# Patient Record
Sex: Female | Born: 1995 | Hispanic: Yes | Marital: Married | Smoking: Never smoker
Health system: Southern US, Community
[De-identification: ages and names within clinical notes are randomized; demographics above are authoritative.]

## PROBLEM LIST (undated history)

## (undated) ENCOUNTER — Inpatient Hospital Stay (HOSPITAL_COMMUNITY): Payer: Self-pay

## (undated) DIAGNOSIS — E559 Vitamin D deficiency, unspecified: Secondary | ICD-10-CM

## (undated) DIAGNOSIS — J45909 Unspecified asthma, uncomplicated: Secondary | ICD-10-CM

## (undated) DIAGNOSIS — Z6221 Child in welfare custody: Secondary | ICD-10-CM

## (undated) DIAGNOSIS — D509 Iron deficiency anemia, unspecified: Secondary | ICD-10-CM

## (undated) HISTORY — DX: Vitamin D deficiency, unspecified: E55.9

## (undated) HISTORY — DX: Unspecified asthma, uncomplicated: J45.909

## (undated) HISTORY — DX: Iron deficiency anemia, unspecified: D50.9

## (undated) HISTORY — DX: Child in welfare custody: Z62.21

---
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
## 2015-01-08 ENCOUNTER — Ambulatory Visit: Payer: Medicaid Other | Admitting: *Deleted

## 2015-03-26 ENCOUNTER — Ambulatory Visit: Payer: Medicaid Other | Admitting: Pediatrics

## 2015-04-24 ENCOUNTER — Encounter: Payer: Self-pay | Admitting: *Deleted

## 2015-04-24 ENCOUNTER — Encounter: Payer: Self-pay | Admitting: Pediatrics

## 2015-04-24 ENCOUNTER — Ambulatory Visit (INDEPENDENT_AMBULATORY_CARE_PROVIDER_SITE_OTHER): Payer: Medicaid Other | Admitting: Pediatrics

## 2015-04-24 VITALS — BP 117/77 | HR 86 | Ht 64.57 in | Wt 264.4 lb

## 2015-04-24 DIAGNOSIS — Z3046 Encounter for surveillance of implantable subdermal contraceptive: Secondary | ICD-10-CM | POA: Diagnosis not present

## 2015-04-24 DIAGNOSIS — R7303 Prediabetes: Secondary | ICD-10-CM | POA: Diagnosis not present

## 2015-04-24 DIAGNOSIS — J45990 Exercise induced bronchospasm: Secondary | ICD-10-CM | POA: Diagnosis not present

## 2015-04-24 DIAGNOSIS — E669 Obesity, unspecified: Secondary | ICD-10-CM

## 2015-04-24 MED ORDER — ALBUTEROL SULFATE HFA 108 (90 BASE) MCG/ACT IN AERS: 2.0000 | INHALATION_SPRAY | Freq: Four times a day (QID) | RESPIRATORY_TRACT | Status: DC | PRN

## 2015-04-24 NOTE — Progress Notes (Signed)
Pre-Visit Planning  Donna [name]  is a 20 y.o. female referred by [facility], MD.   Last seen in [facility] on 12/24/2014 for prediabetes and nexplanon f/u.   Previous Psych Screenings? n/a  Treatment plan at last visit included continue nutrition visits and nexplanon f/u.   Clinical Staff Visit Tasks:   - Urine GC/CT due? no - Psych Screenings Due? no - POCT HgbA1C  Provider Visit Tasks: - Assess nutrition and exercise associated with prediabetes - Consider metformin if continued weight gain and persistent prediabetes - [facility]ment? Maybe - Pertinent Labs? Yes Component     Latest Ref Rng 12/24/2014  Hemoglobin A1C      5.8

## 2015-04-24 NOTE — Progress Notes (Signed)
THIS RECORD MAY CONTAIN CONFIDENTIAL INFORMATION THAT SHOULD NOT BE RELEASED WITHOUT REVIEW OF THE SERVICE PROVIDER.  Adolescent Medicine Consultation Follow-Up Visit Donna [name]  is a 20 y.o. female referred by Karlene Einstein, MD here today for follow-up.    Previsit planning completed:  yes Pre-Visit Planning  Donna [name]  is a 20 y.o. female referred by [facility], Bascom Levels, MD.   Last seen in [facility] on 12/24/2014 for prediabetes and nexplanon f/u.   Previous Psych Screenings? n/a  Treatment plan at last visit included continue nutrition visits and nexplanon f/u.   Clinical Staff Visit Tasks:   - Urine GC/CT due? no - Psych Screenings Due? no - POCT HgbA1C  Provider Visit Tasks: - Assess nutrition and exercise associated with prediabetes - Consider metformin if continued weight gain and persistent prediabetes - [facility] Involvement? Maybe - Pertinent Labs? Yes Component     Latest Ref Rng 12/24/2014  Hemoglobin A1C      5.8   Growth Chart Viewed? yes   History was provided by the patient.  PCP Confirmed?  yes  My Chart Activated?   yes   HPI:    Started going to gym twice weekly, has not seen much change in weight  Discussed other benefits of exercise Pleased with nexplanon  Using albuterol pre and post exercise and it helps a lot.  Needs refill.  No LMP recorded. Allergies  Allergen Reactions  . Penicillins Swelling   Outpatient Encounter Prescriptions as of 04/24/2015  Medication Sig  . albuterol (PROVENTIL HFA;VENTOLIN HFA) 108 (90 Base) MCG/ACT inhaler Inhale 2 puffs into the lungs every 6 (six) hours as needed for wheezing or shortness of breath.  . Multiple Vitamin (MULTIVITAMIN) tablet Take 1 tablet by mouth daily.  . [DISCONTINUED] albuterol (PROVENTIL HFA;VENTOLIN HFA) 108 (90 BASE) MCG/ACT inhaler Inhale 2 puffs into the lungs every 6 (six) hours as needed for wheezing or shortness of breath.  . [DISCONTINUED] ALBUTEROL IN Inhale into  the lungs.   No facility-administered encounter medications on file as of 04/24/2015.     Patient Active Problem List   Diagnosis Date Noted  . Implantable subdermal contraceptive surveillance 10/11/2014  . Iron deficiency anemia 02/19/2014  . Pre-diabetes 09/06/2013  . Seasonal allergies 07/24/2013  . Teenage mother 06/05/2013  . 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 Obesity (BMI 30-39.9) 06/05/2013  . Nevus 06/05/2013     Social History   Social History Narrative  No change in sexual partner  The following portions of the patient's history were reviewed and updated as appropriate: allergies, current medications and problem list.  Physical Exam:  Filed Vitals:   04/24/15 1013  BP: 117/77  Pulse: 86  Height: 5' 4.57" (1.64 m)  Weight: 264 lb 6.4 oz (119.931 kg)   BP 117/77 mmHg  Pulse 86  Ht 5' 4.57" (1.64 m)  Wt 264 lb 6.4 oz (119.931 kg)  BMI 44.59 kg/m2 Body mass index: body mass index is 44.59 kg/(m^2). Blood pressure percentiles are [id number] systolic and A999333 diastolic based on [id number] NHANES data. Blood pressure percentile targets: 90: 124/78, 95: 127/82, 99 + 5 mmHg: 140/95.  Physical Exam  Constitutional: No distress.  Neck: No thyromegaly present.  Cardiovascular: Normal rate and regular rhythm.   No murmur heard. Pulmonary/Chest: Breath sounds normal.  Abdominal: Soft. There is no tenderness. There is no guarding.  Musculoskeletal: She exhibits no edema.  Lymphadenopathy:    She has no cervical adenopathy.  Neurological: She is alert.  Skin:  Acanthosis  nigricans  Nursing note and vitals reviewed.  Assessment/Plan: 1. Pre-diabetes 2. Obesity (BMI 30-39.9) Positive reinforced lifestyle changes. - Hemoglobin A1c - Amb ref to Medical Nutrition Therapy-MNT 3. Exercise-induced bronchospasm - albuterol (PROVENTIL HFA;VENTOLIN HFA) 108 (90 Base) MCG/ACT inhaler; Inhale 2 puffs into the lungs every 6 (six) hours as needed for wheezing or shortness of breath.   Dispense: 1 Inhaler; Refill: 0  4. Implantable subdermal contraceptive surveillance Continue with this pregnancy prevention method.   Follow-up:  Return in about 3 months (around 07/22/2015) for Prediabetes, with Chrys Racer.   Medical decision-making:  > 25 minutes spent, more than 50% of appointment was spent discussing diagnosis and management of symptoms

## 2015-06-02 ENCOUNTER — Ambulatory Visit: Payer: Self-pay | Admitting: *Deleted

## 2015-06-06 ENCOUNTER — Encounter: Payer: Self-pay | Admitting: Pediatrics

## 2015-06-06 ENCOUNTER — Ambulatory Visit (INDEPENDENT_AMBULATORY_CARE_PROVIDER_SITE_OTHER): Payer: Medicaid Other | Admitting: Pediatrics

## 2015-06-06 VITALS — Temp 97.0°F | Wt 266.8 lb

## 2015-06-06 DIAGNOSIS — J069 Acute upper respiratory infection, unspecified: Secondary | ICD-10-CM | POA: Diagnosis not present

## 2015-06-06 DIAGNOSIS — B9789 Other viral agents as the cause of diseases classified elsewhere: Principal | ICD-10-CM

## 2015-06-06 NOTE — Progress Notes (Signed)
  Subjective:    Donna [name] is a 20 y.o. old female here with her son for Cough and Fever .   She presents with 3-4 days of fevers to 103 which has come down with Tylenol. She also has cough. She has had runny nose. She has lost her appetite as well. Boyfriend has been sick with same problems. 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 No sore throat. But is having some headache and ear pain.   HPI  Review of Systems  History and Problem List: Donna [name] has Teenage mother; Exercise-induced bronchospasm; Obesity (BMI 30-39.9); Nevus; Seasonal allergies; Pre-diabetes; Iron deficiency anemia; and Implantable subdermal contraceptive surveillance on her problem list.  Donna [name]  has a past medical history of Asthma; Vitamin D deficiency (09/06/2013); and Child in foster care (06/05/2013).  Immunizations needed: none     Objective:    Temp(Src) 97 F (36.1 C) (Temporal)  Wt 266 lb 12.8 oz (121.02 kg)  LMP 12/07/2014 (Within Months) Physical Exam  Constitutional: She is oriented to person, place, and time. She appears well-developed.  Tired appearing   HENT:  Right Ear: External ear normal.  Left Ear: External ear normal.  Mouth/Throat: Oropharynx is clear and moist. No oropharyngeal exudate.  Eyes: Pupils are equal, round, and reactive to light. Right eye exhibits no discharge.  Neck: Normal range of motion. Neck supple. No thyromegaly present.  Cardiovascular: Normal rate and regular rhythm.   No murmur heard. Pulmonary/Chest: Effort normal and breath sounds normal. No respiratory distress. She has no wheezes.  Abdominal: Soft. She exhibits no distension. There is no tenderness.  Musculoskeletal: Normal range of motion. She exhibits no edema.  Neurological: She is alert and oriented to person, place, and time.  Skin: Skin is warm.  Psychiatric: She has a normal mood and affect. Her behavior is normal.       Assessment and Plan:     Donna [name] was seen today for Cough and  Fever Donna [name] likely has a viral uri based on duration of symptoms and physical exam ( normal work of breathing) . Given high BMI and comorbidities we considered flu swabbing but she would be out of tamiflu window and appears relatively well on exam, therefore we did not swab. Supportive care recommended.    Problem List Items Addressed This Visit    None    Visit Diagnoses    Viral URI with cough    -  Primary       Return if symptoms worsen or fail to improve.  Donna [name], Debbra Riding, MD

## 2015-06-06 NOTE — Patient Instructions (Signed)
Rest, lots of fluids, should improve by Monday.   Infecciones virales (Viral Infections) La causa de las infecciones virales son diferentes tipos de virus.La mayora de las infecciones virales no son graves y se curan solas. Sin embargo, algunas infecciones pueden provocar sntomas graves y causar complicaciones.  SNTOMAS Las infecciones virales ocasionan:   Dolores de Investment banker, operational.  Molestias.  Dolor de [place].  Mucosidad nasal.  Diferentes tipos de erupcin.  Lagrimeo.  Cansancio.  Tos.  Prdida del apetito.  Infecciones gastrointestinales que producen nuseas, vmitos y [place]. Estos sntomas no responden a los antibiticos porque la infeccin no es por bacterias. Sin embargo, puede sufrir una infeccin bacteriana luego de la infeccin viral. Se denomina sobreinfeccin. Los sntomas de esta infeccin bacteriana son:   Kingsley Spittle dolor en la garganta con pus y dificultad para tragar.  Ganglios hinchados en el cuello.  Escalofros y fiebre muy elevada o persistente.  Dolor de cabeza intenso.  Sensibilidad en los senos paranasales.  Malestar (sentirse enfermo) general persistente, dolores musculares y fatiga (cansancio).  Tos persistente.  Produccin mucosa con la tos, de color amarillo, verde o marrn. INSTRUCCIONES PARA EL CUIDADO DOMICILIARIO  Solo tome medicamentos que se pueden comprar sin receta o recetados para Conservation officer, historic buildings, Tree surgeon, la diarrea o la fiebre, como le indica el mdico.  Beba gran cantidad de lquido para mantener la orina de tono claro o color amarillo plido. Las bebidas deportivas proporcionan electrolitos,azcares e hidratacin.  Descanse lo suficiente y [employer]. Puede tomar sopas y caldos con crackers o arroz. SOLICITE ATENCIN [place] DE INMEDIATO SI:  Tiene dolor de cabeza, le falta el aire, siente dolor en el pecho, en el cuello o aparece una erupcin.  Tiene vmitos o diarrea intensos y no puede retener lquidos.  Usted o su  nio tienen una temperatura oral de ms de 38,9 C (102 F) y no puede controlarla con medicamentos.  Su beb tiene ms de 3 meses y su temperatura rectal es de 102 F (38.9 C) o ms.  Su beb tiene 3 meses o menos y su temperatura rectal es de 100.4 F (38 C) o ms. EST SEGURO QUE:   Comprende las instrucciones para el alta mdica.  Controlar su enfermedad.  Solicitar atencin mdica de inmediato segn las indicaciones.   Esta informacin no tiene Marine scientist el consejo del mdico. Asegrese de hacerle al mdico cualquier pregunta que tenga.   Document Released: 12/16/2004 Document Revised: 05/31/2011 Elsevier Interactive Patient Education [employer].

## 2015-06-06 NOTE — Progress Notes (Signed)
I saw and evaluated the patient, performing the key elements of the service. I developed the management plan that is described in the resident's note, and I agree with the content.   Georgia Duff B                  06/06/2015, 10:54 PM

## 2015-06-14 ENCOUNTER — Emergency Department (HOSPITAL_COMMUNITY): Payer: Self-pay

## 2015-06-14 ENCOUNTER — Encounter (HOSPITAL_COMMUNITY): Payer: Self-pay | Admitting: Emergency Medicine

## 2015-06-14 ENCOUNTER — Emergency Department (HOSPITAL_COMMUNITY)
Admission: EM | Admit: 2015-06-14 | Discharge: 2015-06-14 | Disposition: A | Discharge status: Home / Self Care | Payer: Self-pay | Attending: Emergency Medicine | Admitting: Emergency Medicine

## 2015-06-14 DIAGNOSIS — J45909 Unspecified asthma, uncomplicated: Secondary | ICD-10-CM | POA: Insufficient documentation

## 2015-06-14 DIAGNOSIS — S199XXA Unspecified injury of neck, initial encounter: Secondary | ICD-10-CM | POA: Insufficient documentation

## 2015-06-14 DIAGNOSIS — Y92410 Unspecified street and highway as the place of occurrence of the external cause: Secondary | ICD-10-CM | POA: Insufficient documentation

## 2015-06-14 DIAGNOSIS — Y998 Other external cause status: Secondary | ICD-10-CM | POA: Insufficient documentation

## 2015-06-14 DIAGNOSIS — S29001A Unspecified injury of muscle and tendon of front wall of thorax, initial encounter: Secondary | ICD-10-CM | POA: Insufficient documentation

## 2015-06-14 DIAGNOSIS — E669 Obesity, unspecified: Secondary | ICD-10-CM | POA: Insufficient documentation

## 2015-06-14 DIAGNOSIS — Z880 Allergy status to penicillin: Secondary | ICD-10-CM | POA: Insufficient documentation

## 2015-06-14 DIAGNOSIS — S8991XA Unspecified injury of right lower leg, initial encounter: Secondary | ICD-10-CM | POA: Insufficient documentation

## 2015-06-14 DIAGNOSIS — S3991XA Unspecified injury of abdomen, initial encounter: Secondary | ICD-10-CM | POA: Insufficient documentation

## 2015-06-14 DIAGNOSIS — Z79899 Other long term (current) drug therapy: Secondary | ICD-10-CM | POA: Insufficient documentation

## 2015-06-14 DIAGNOSIS — S4992XA Unspecified injury of left shoulder and upper arm, initial encounter: Secondary | ICD-10-CM | POA: Insufficient documentation

## 2015-06-14 DIAGNOSIS — Y9389 Activity, other specified: Secondary | ICD-10-CM | POA: Insufficient documentation

## 2015-06-14 DIAGNOSIS — M7918 Myalgia, other site: Secondary | ICD-10-CM

## 2015-06-14 DIAGNOSIS — Z3202 Encounter for pregnancy test, result negative: Secondary | ICD-10-CM | POA: Insufficient documentation

## 2015-06-14 LAB — CBC WITH DIFFERENTIAL/PLATELET
BASOS ABS: 0 10*3/uL (ref 0.0–0.1)
BASOS PCT: 0 %
EOS ABS: 2.1 10*3/uL — AB (ref 0.0–0.7)
Eosinophils Relative: 17 %
HCT: 40.4 % (ref 36.0–46.0)
Hemoglobin: 14.1 g/dL (ref 12.0–15.0)
Lymphocytes Relative: 25 %
Lymphs Abs: 3.1 10*3/uL (ref 0.7–4.0)
MCH: 27.5 pg (ref 26.0–34.0)
MCHC: 34.9 g/dL (ref 30.0–36.0)
MCV: 78.9 fL (ref 78.0–100.0)
MONO ABS: 0.8 10*3/uL (ref 0.1–1.0)
MONOS PCT: 6 %
NEUTROS PCT: 52 %
Neutro Abs: 6.7 10*3/uL (ref 1.7–7.7)
Platelets: 368 10*3/uL (ref 150–400)
RBC: 5.12 MIL/uL — ABNORMAL HIGH (ref 3.87–5.11)
RDW: 13.3 % (ref 11.5–15.5)
WBC: 12.7 10*3/uL — ABNORMAL HIGH (ref 4.0–10.5)

## 2015-06-14 LAB — I-STAT CHEM 8, ED
BUN: 10 mg/dL (ref 6–20)
Calcium, Ion: 1.19 mmol/L (ref 1.12–1.23)
Chloride: 105 mmol/L (ref 101–111)
Creatinine, Ser: 0.6 mg/dL (ref 0.44–1.00)
Glucose, Bld: 138 mg/dL — ABNORMAL HIGH (ref 65–99)
HCT: 46 % (ref 36.0–46.0)
Hemoglobin: 15.6 g/dL — ABNORMAL HIGH (ref 12.0–15.0)
Potassium: 3.7 mmol/L (ref 3.5–5.1)
Sodium: 141 mmol/L (ref 135–145)
TCO2: 22 mmol/L (ref 0–100)

## 2015-06-14 LAB — I-STAT BETA HCG BLOOD, ED (MC, WL, AP ONLY): I-stat hCG, quantitative: <5 m[IU]/mL

## 2015-06-14 MED ORDER — MORPHINE SULFATE (PF) 4 MG/ML IV SOLN
4.0000 mg | Freq: Once | INTRAVENOUS | Status: AC
  Administered 2015-06-14: 4 mg via INTRAVENOUS
  Filled 2015-06-14: qty 1

## 2015-06-14 MED ORDER — ONDANSETRON HCL 4 MG/2ML IJ SOLN
4.0000 mg | Freq: Once | INTRAMUSCULAR | Status: AC
  Administered 2015-06-14: 4 mg via INTRAVENOUS
  Filled 2015-06-14: qty 2

## 2015-06-14 MED ORDER — IOPAMIDOL (ISOVUE-300) INJECTION 61%
100.0000 mL | Freq: Once | INTRAVENOUS | Status: AC | PRN
  Administered 2015-06-14: 100 mL via INTRAVENOUS

## 2015-06-14 MED ORDER — OXYCODONE-ACETAMINOPHEN 5-325 MG PO TABS: ORAL_TABLET | ORAL | Status: DC

## 2015-06-14 NOTE — ED Notes (Signed)
Patient d/c'd self care.  F/U and medications reviewed.  Patient verbalized understanding. 
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
## 2015-07-15 ENCOUNTER — Encounter: Payer: Self-pay | Admitting: Pediatrics

## 2015-07-15 NOTE — Progress Notes (Signed)
Pre-Visit Planning  Donna [name]  is a 20 y.o. female referred by [facility], MD.   Last seen in [facility] on 04/24/2015 for nexplanon f/u and prediabetes/obesity.   Plan included continue nexplanon, reinforced lifestyle changes and re-referred to nutrition.  Date and Type of Previous Psych Screenings? NA  Clinical Staff Visit Tasks:   - Urine GC/CT due? no - HIV Screening due?  yes - Psych Screenings Due? NA  Provider Visit Tasks: - Review prediabetes, check hgba1c - Assess nexplanon risks and benefits - [facility] Involvement? Maybe - Pertinent Labs? No  >5 minutes spent reviewing records and planning for patient's visit.

## 2015-07-16 ENCOUNTER — Ambulatory Visit (INDEPENDENT_AMBULATORY_CARE_PROVIDER_SITE_OTHER): Payer: Medicaid Other | Admitting: Pediatrics

## 2015-07-16 ENCOUNTER — Encounter: Payer: Self-pay | Admitting: Pediatrics

## 2015-07-16 VITALS — BP 115/79 | HR 93 | Ht 64.57 in | Wt 271.4 lb

## 2015-07-16 DIAGNOSIS — Z113 Encounter for screening for infections with a predominantly sexual mode of transmission: Secondary | ICD-10-CM

## 2015-07-16 DIAGNOSIS — Z309 Encounter for contraceptive management, unspecified: Secondary | ICD-10-CM

## 2015-07-16 DIAGNOSIS — J45990 Exercise induced bronchospasm: Secondary | ICD-10-CM

## 2015-07-16 DIAGNOSIS — Z3009 Encounter for other general counseling and advice on contraception: Secondary | ICD-10-CM

## 2015-07-16 DIAGNOSIS — R7303 Prediabetes: Secondary | ICD-10-CM

## 2015-07-16 LAB — HEPATIC FUNCTION PANEL
ALK PHOS: 77 U/L (ref 47–176)
ALT: 27 U/L (ref 5–32)
AST: 19 U/L (ref 12–32)
Albumin: 4 g/dL (ref 3.6–5.1)
BILIRUBIN DIRECT: 0.1 mg/dL (ref <=0.2)
BILIRUBIN INDIRECT: 0.3 mg/dL (ref 0.2–1.1)
Total Bilirubin: 0.4 mg/dL (ref 0.2–1.1)
Total Protein: 6.9 g/dL (ref 6.3–8.2)

## 2015-07-16 LAB — HEMOGLOBIN A1C
Hgb A1c MFr Bld: 5.5 % (ref <5.7)
MEAN PLASMA GLUCOSE: 111 mg/dL

## 2015-07-16 MED ORDER — METFORMIN HCL ER 500 MG PO TB24: 500.0000 mg | ORAL_TABLET | Freq: Every day | ORAL | Status: DC

## 2015-07-16 MED ORDER — ALBUTEROL SULFATE HFA 108 (90 BASE) MCG/ACT IN AERS: 2.0000 | INHALATION_SPRAY | Freq: Four times a day (QID) | RESPIRATORY_TRACT | Status: DC | PRN

## 2015-07-16 NOTE — Patient Instructions (Addendum)
We will start metformin ( a medication to help you control your blood sugar).  You should also start on a multivitamin.  We want to ensure you will have a healthy pregnancy and treating your prediabetes is a really important part of that.  We will follow you closely every 2 weeks until you have enough medicine to treat the prediabetes.  We have re-opened the referral to Nutrition.  You can call their office to schedule an appointment, [phone].

## 2015-07-16 NOTE — Progress Notes (Signed)
Adolescent Medicine Consultation Follow-Up Visit Donna [name]  is a 20 y.o. female referred by [facility], Bascom Levels, MD here today for follow-up of nexplanon, pre-diabetes/obestiy.   Previsit planning completed:  yes  Growth Chart Viewed? yes  PCP Confirmed?  Yes, Karlene Einstein   History was provided by the patient.  HPI:   Nexplanon: Patient requests removal of nexplanon at this time. She believes that nexplanon is preventing her from losing weight. She is not interested in alternate method of BC because she is interested in having another baby. She anticipates getting pregnant in the next 4-5 months. She would like to lose weight prior to pregnancy. She denies additional side effects from nexplanon (no BTB). Since her last visit, Donna [name] is now married, (married partner of 3 years 2 months ago). She has moved to a new apartment in [place] and is living with husband and son. She has adjusted well to being married. Gets along well with husband. Mom is nervous about planning for another baby. Dad is READY to have another (would like a baby girl). She denies excessive pressure from husband to have another baby.   Pre-diabetes: Laihla has started an exercise regimen. She is going to gym 3-4 times weekly for the past 3 months. Exercise regimen consists of working out 1-2 hours. She is disappointed because she has not seen any weight loss. She walks for 45 minutes, 20 minutes abdominal exercises, and 20 minutes legs machine with weights weight. No trainer at this time. Friend is doing the same regimen and losing weight. Has a gym partner who is going with her and has been successful at losing weight.  She eats three meals daily. Egg sandwich in AM, cereal. Lunch- chikcen, rice, salad, lettuce. Dinner: cooks sphagetti, soup, rice with chicken, carrots. Was called by nutrition, but did not call back to schedule appointment. Would be interested in meeting with nutrition.    No LMP recorded. Patient has had an  implant.  The following portions of the patient's history were reviewed and updated as appropriate: allergies, current medications, past family history, past medical history, past social history, past surgical history and problem list.  Allergies  Allergen Reactions  . Penicillins Swelling    Swelling of the face  Has patient had a PCN reaction causing immediate rash, facial/tongue/throat swelling, SOB or lightheadedness with hypotension: Yes Has patient had a PCN reaction causing severe rash involving mucus membranes or skin necrosis: No Has patient had a PCN reaction that required hospitalization No Has patient had a PCN reaction occurring within the last 10 years: Yes - was taken 5 years ago  If all of the above answers are "NO", then may proceed with Cephalosporin use.      Social History: Lives with husband, son Einar Pheasant, 2).  Sleep: sleeping well bed at 10:30 up at [place]: [employer] for high school diploma. Graduation July 11th. Starting [employer]). Husband works in Financial planner.   Confidentiality was discussed with the patient and if applicable, with caregiver as well. Tobacco? no Secondhand smoke exposure?no Drugs/EtOH?no Sexually active?yes 1 female partner, no new partners Pregnancy Prevention: nexplanon in place, Safe at home, in school & in relationships? Yes Safe to self? Yes  Physical Exam:  Filed Vitals:   07/16/15 0939  BP: 115/79  Pulse: 93  Height: 5' 4.57" (1.64 m)  Weight: 271 lb 6.4 oz (123.106 kg)   BP 115/79 mmHg  Pulse 93  Ht 5' 4.57" (1.64 m)  Wt 271 lb 6.4  oz (123.106 kg)  BMI 45.77 kg/m2 Body mass index: body mass index is 45.77 kg/(m^2). Blood pressure percentiles are A999333 systolic and [id number] diastolic based on [id number] NHANES data. Blood pressure percentile targets: 90: 123/78, 95: 127/82, 99 + 5 mmHg: 139/94.  Physical Exam   General:   alert, cooperative and no distress. Obese adolescent female. Sitting upright in chair.    Skin:   acanthosis nigricans to neck   Oral cavity:   lips, mucosa, and tongue normal; teeth and gums normal  Eyes:   sclerae white, pupils equal and reactive, red reflex normal bilaterally  Ears:   normal bilaterally  Nose: clear, no discharge  Neck:  Neck appearance: Normal  Lungs:  clear to auscultation bilaterally  Heart:   regular rate and rhythm, S1, S2 normal, no murmur, click, rub or gallop   Abdomen:  Obese abdomen, soft, non-tender; bowel sounds normal; no masses,  no organomegaly  GU:  normal female - testes descended bilaterally and uncircumcised  Extremities:   extremities normal, atraumatic, no cyanosis or edema  Neuro:  normal without focal findings, mental status, speech normal, alert and oriented x3, PERLA, cranial nerves 2-12 intact, muscle tone and strength normal and symmetric, reflexes normal and symmetric and sensation grossly normal    Assessment/Plan: 1. Pre-diabetes Patient endorses compliance with exercise regimen and no successful weight loss. She is frustrated by this. We discussed pre-diabetes and ramifications of insulin resistance and weight. Recommended weight loss to promote future heathy pregnancy and general health. Will obtain baseline HFP and follow up A1C (last 10/4, 5.8).  Patient in agreement with starting metformin at this time. Counseled regarding side effects. Emphasis placed on balanced nutrition. Counseled to call nutritionist to schedule appointment. Will follow up in 2 weeks.  - Hemoglobin A1c - Hepatic function panel - metFORMIN (GLUCOPHAGE XR) 500 MG 24 hr tablet; Take 1 tablet (500 mg total) by mouth daily with breakfast.  Dispense: 30 tablet; Refill: 1  2. Screen for sexually transmitted diseases Patient denies new sexual partner, but has not been screened for HIV. Will obtain today.  - HIV antibody  3. Exercise-induced bronchospasm Patient requests refill of albuterol inhaler. Will refill today.  - albuterol (PROVENTIL HFA;VENTOLIN HFA)  108 (90 Base) MCG/ACT inhaler; Inhale 2 puffs into the lungs every 6 (six) hours as needed for wheezing or shortness of breath.  Dispense: 1 Inhaler; Refill: 0  4. Encounter for counseling regarding contraception Emphasis placed on importance of maternal health and neonatal health at this visit. Patient in agreement to continue nexplanon at this time during trial of metformin, exercise, and nutrition. She will continue nexplanon for the next 3 months while optimizing health. Counseled to initiate pre-natal vitamins at this time.   Follow-up:  Return in about 2 weeks (around 07/30/2015) for Prediabetes, with Chrys Racer.   Medical decision-making:  > 25 minutes spent, more than 50% of appointment was spent discussing diagnosis and management of symptoms

## 2015-07-17 LAB — HIV ANTIBODY (ROUTINE TESTING W REFLEX): HIV: NONREACTIVE

## 2015-07-17 NOTE — Progress Notes (Signed)
Quick Note:  Please notify patient that her labs were all normal, including her hgba1C. This means that although she has not lost weight, the exercise she has been doing has resulted in an improvement in her labs. She should continue working to improve her health as likely she will experience more results including her goal of weight loss. She should continue the metformin for now. ______

## 2015-07-18 ENCOUNTER — Telehealth: Payer: Self-pay | Admitting: *Deleted

## 2015-07-18 NOTE — Telephone Encounter (Signed)
TC to pt. Updated on labs. Pt verbalized understanding.

## 2015-07-18 NOTE — Telephone Encounter (Signed)
-----   Message from Gaspar Skeeters, MD sent at 07/17/2015 10:17 PM EDT ----- Please notify patient that her labs were all normal, including her hgba1C.  This means that although she has not lost weight, the exercise she has been doing has resulted in an improvement in her labs.  She should continue working to improve her health as likely she will experience more results including her goal of weight loss.  She should continue the metformin for now.

## 2015-07-21 ENCOUNTER — Ambulatory Visit: Payer: Self-pay | Admitting: Pediatrics

## 2015-07-25 ENCOUNTER — Other Ambulatory Visit: Payer: Self-pay | Admitting: Pediatrics

## 2015-07-25 DIAGNOSIS — R7303 Prediabetes: Secondary | ICD-10-CM

## 2015-07-25 MED ORDER — METFORMIN HCL ER 500 MG PO TB24: ORAL_TABLET | ORAL | Status: DC

## 2015-07-30 ENCOUNTER — Ambulatory Visit: Payer: Medicaid Other | Admitting: Pediatrics

## 2015-08-21 ENCOUNTER — Encounter: Payer: Self-pay | Admitting: Pediatrics

## 2015-08-21 ENCOUNTER — Ambulatory Visit: Payer: Medicaid Other | Admitting: Pediatrics

## 2015-08-21 NOTE — Progress Notes (Signed)
Pre-Visit Planning  Donna [name]  is a 20 y.o. female referred by [facility], Bascom Levels, MD.   Last seen in [facility] on 07/16/15 for prediabetes, contraception   Plan at last visit included discuss family planning goals, preconception health.  Date and Type of Previous Psych Screenings? NO  Clinical Staff Visit Tasks:   - Urine GC/CT due? no - HIV Screening due?  no - Psych Screenings Due? No  Provider Visit Tasks: - start PNV - discuss success with health - [facility] Involvement? No - Pertinent Labs? Yes  Results for orders placed or performed in visit on 07/16/15  Hemoglobin A1c  Result Value Ref Range   Hgb A1c MFr Bld 5.5 <5.7 %   Mean Plasma Glucose 111 mg/dL  HIV antibody  Result Value Ref Range   HIV 1&2 Ab, 4th Generation NONREACTIVE NONREACTIVE  Hepatic function panel  Result Value Ref Range   Total Bilirubin 0.4 0.2 - 1.1 mg/dL   Bilirubin, Direct 0.1 <=0.2 mg/dL   Indirect Bilirubin 0.3 0.2 - 1.1 mg/dL   Alkaline Phosphatase 77 47 - 176 U/L   AST 19 12 - 32 U/L   ALT 27 5 - 32 U/L   Total Protein 6.9 6.3 - 8.2 g/dL   Albumin 4.0 3.6 - 5.1 g/dL     >5 minutes spent reviewing records and planning for patient's visit.

## 2015-10-07 ENCOUNTER — Ambulatory Visit (INDEPENDENT_AMBULATORY_CARE_PROVIDER_SITE_OTHER): Payer: Medicaid Other | Admitting: Family

## 2015-10-07 ENCOUNTER — Encounter: Payer: Self-pay | Admitting: Family

## 2015-10-07 VITALS — BP 127/75 | HR 82 | Ht 64.5 in | Wt 273.0 lb

## 2015-10-07 DIAGNOSIS — Z3046 Encounter for surveillance of implantable subdermal contraceptive: Secondary | ICD-10-CM

## 2015-10-07 MED ORDER — PRENATAL VITAMINS PLUS 27-1 MG PO TABS: 1.0000 | ORAL_TABLET | Freq: Every day | ORAL | Status: DC

## 2015-10-07 NOTE — Progress Notes (Signed)
THIS RECORD MAY CONTAIN CONFIDENTIAL INFORMATION THAT SHOULD NOT BE RELEASED WITHOUT REVIEW OF THE SERVICE PROVIDER.  Adolescent Medicine Consultation Follow-Up Visit Donna [name]  is a 20 y.o. female referred by Karlene Einstein, MD here today for follow-up.    Previsit planning completed:  no  Growth Chart Viewed? no   History was provided by the patient.  PCP Confirmed?  Yes, [place]   My Chart Activated?   yes   HPI:    20 yo G1P1 female presents for nexplanon removal. Her last OV was 07/16/15 where she requested removal based on her belief that it was causing her to not lose weight. Counsel was given at that time on preconception health in the context of her interests in growing her family.  She returns to clinic today reporting that she wants the nexplanon removed and plans to continue with her weight loss efforts. She is interested in starting prenatal vitamins when this is mentioned also. She plans to use condoms with husband for several months as she continues weight loss efforts.   No LMP recorded. Patient has had an implant. Allergies  Allergen Reactions  . Penicillins Swelling    Swelling of the face  Has patient had a PCN reaction causing immediate rash, facial/tongue/throat swelling, SOB or lightheadedness with hypotension: Yes Has patient had a PCN reaction causing severe rash involving mucus membranes or skin necrosis: No Has patient had a PCN reaction that required hospitalization No Has patient had a PCN reaction occurring within the last 10 years: Yes - was taken 5 years ago  If all of the above answers are "NO", then may proceed with Cephalosporin use.     Outpatient Prescriptions Prior to Visit  Medication Sig Dispense Refill  . etonogestrel (NEXPLANON) 68 MG IMPL implant 1 each by Subdermal route once. Reported on 10/07/2015    . albuterol (PROVENTIL HFA;VENTOLIN HFA) 108 (90 Base) MCG/ACT inhaler Inhale 2 puffs into the lungs every 6 (six) hours as needed for  wheezing or shortness of breath. (Patient not taking: Reported on 10/07/2015) 1 Inhaler 0  . metFORMIN (GLUCOPHAGE XR) 500 MG 24 hr tablet Take 1 tablet daily for 2 weeks, 2 tablets daily for 2 weeks and 3 tablets daily after that with dinner (Patient not taking: Reported on 10/07/2015) 90 tablet 1   No facility-administered medications prior to visit.     Patient Active Problem List   Diagnosis Date Noted  . Implantable subdermal contraceptive surveillance 10/11/2014  . Iron deficiency anemia 02/19/2014  . Pre-diabetes 09/06/2013  . Seasonal allergies 07/24/2013  . Teenage mother 06/05/2013  . 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 Obesity (BMI 30-39.9) 06/05/2013  . Nevus 06/05/2013    The following portions of the patient's history were reviewed and updated as appropriate: allergies, current medications, past family history, past medical history, past social history, past surgical history and problem list.  Physical Exam:  Filed Vitals:   10/07/15 1101  BP: 127/75  Pulse: 82  Height: 5' 4.5" (1.638 m)  Weight: 273 lb (123.832 kg)   BP 127/75 mmHg  Pulse 82  Ht 5' 4.5" (1.638 m)  Wt 273 lb (123.832 kg)  BMI 46.15 kg/m2 Body mass index: body mass index is 46.15 kg/(m^2). Blood pressure percentiles are 0000000 systolic and 123456 diastolic based on [id number] NHANES data. Blood pressure percentile targets: 90: 123/77, 95: 126/81, 99 + 5 mmHg: 139/94.  Wt Readings from Last 3 Encounters:  10/07/15 273 lb (123.832 kg) (100 %*, Z = 2.68)  07/16/15  271 lb 6.4 oz (123.106 kg) (100 %*, Z = 2.66)  06/06/15 266 lb 12.8 oz (121.02 kg) (100 %*, Z = 2.62)   * Growth percentiles are based on CDC 2-20 Years data.    Physical Exam  Constitutional: She appears well-nourished.  Eyes: EOM are normal.  Neck: Normal range of motion.  Cardiovascular: Normal rate and regular rhythm.   No murmur heard. Abdominal: Soft.  Musculoskeletal: Normal range of motion.  Neurological: She is alert. She has  normal reflexes.  Skin: Skin is warm and dry.  Psychiatric: She has a normal mood and affect. Her behavior is normal.    Assessment/Plan: 1. Encounter for Nexplanon removal Risks & benefits of Nexplanon removal discussed. Consent form signed.  The patient denies any allergies to anesthetics or antiseptics.  Procedure: Pt was placed in supine position. left arm was flexed at the elbow and externally rotated so that her wrist was parallel to her ear, The device was palpated and marked. The site was cleaned with Betadine. The area surrounding the device was covered with a sterile drape. 1% lidocaine was injected just under the device. A scalpel was used to create a small incision. The device was pushed towards the incision. Fibrous tissue surrounding the device was gradually removed from the device. The device was removed and measured to ensure all 4 cm of device was removed. Steri-strips were used to close the incision. Pressure dressing was applied to the patient.  The patient was instructed to removed the pressure dressing in 24 hrs.  The patient was advised to move slowly from a supine to an upright position  The patient denied any concerns or complaints  The patient was instructed to schedule a follow-up appt in 1 month. The patient will be called in 1 week to address any concerns.  Prenatal vitamins given; counseled on continued lifestyle choices around healthy prepregnancy weight.   Follow-up:  Return in about 1 month (around 11/07/2015) for with any [facility] provider, GYN/Reproductive Health concerns.   Medical decision-making:  > 25 minutes spent, more than 50% of appointment was spent discussing diagnosis and management of symptoms

## 2015-10-07 NOTE — Patient Instructions (Signed)
Follow-up  in 1 month. Schedule this appointment before you leave clinic today.  Your Nexplanon was removed today and is no longer preventing pregnancy.  If you have sex, remember to use condoms to prevent pregnancy and to prevent sexually transmitted infections.  Leave the outside bandage on for 24 hours.  Leave the smaller bandages on for 3-5 days or until they fall off on their own.  Keep the area clean and dry for 3-5 days.  There is usually bruising or swelling at and around the removal site for a few days to a week after the removal.  If you see redness or pus draining from the removal site, call us immediately.  We would like you to return to the clinic for a follow-up visit in 1 month.  You can call [facility] 24 hours a day with any questions or concerns.  There is always a nurse or doctor available to take your call.  Call 9-1-1 if you have a life-threatening emergency.  For anything else, please call us at [phone] before heading to the ER.

## 2015-10-16 ENCOUNTER — Encounter: Payer: Self-pay | Admitting: Pediatrics

## 2015-11-05 ENCOUNTER — Encounter: Payer: Self-pay | Admitting: Family

## 2015-11-05 ENCOUNTER — Ambulatory Visit (INDEPENDENT_AMBULATORY_CARE_PROVIDER_SITE_OTHER): Payer: Medicaid Other | Admitting: Family

## 2015-11-05 VITALS — BP 117/71 | HR 94 | Ht 64.0 in | Wt 276.6 lb

## 2015-11-05 DIAGNOSIS — Z3201 Encounter for pregnancy test, result positive: Secondary | ICD-10-CM | POA: Diagnosis not present

## 2015-11-05 LAB — POCT URINE PREGNANCY: Preg Test, Ur: POSITIVE — AB

## 2015-11-05 NOTE — Patient Instructions (Signed)
[facility]:  [place] [facility]: To make an appointment for [facility] prenatal care at the [facility] ([facility]) in [place] or [place], please call [phone].

## 2015-11-05 NOTE — Progress Notes (Signed)
THIS RECORD MAY CONTAIN CONFIDENTIAL INFORMATION THAT SHOULD NOT BE RELEASED WITHOUT REVIEW OF THE SERVICE PROVIDER.  Adolescent Medicine Consultation Follow-Up Visit Donna [name]  is a 20 y.o. female referred by Karlene Einstein, MD here today for follow-up regarding nexplanon follow-up.    Last seen in [facility] on 10/07/15 for nexplanon removal. She desires pregnancy, weight loss. Was given PNVs.    CC: desires pregnancy test  HPI:  20 yo G1P1 presents for one month follow-up after nexplanon removal.  She reports condom use and taking prenatal vitamins. No concerns at nexplanon removal site.  Denies abdominal or pelvic pain; no vaginal bleeding, discharge changes or lesions.  Son present at visit, appropriate bonding/interactions noted.   Review of Systems  Constitutional: Negative.   HENT: Negative.   Eyes: Negative.   Respiratory: Negative.   Gastrointestinal: Negative.   Musculoskeletal: Negative.   Skin: Negative.   Neurological: Negative.   Endo/Heme/Allergies: Negative.     No LMP recorded (within months). Allergies  Allergen Reactions  . Penicillins Swelling    Swelling of the face  Has patient had a PCN reaction causing immediate rash, facial/tongue/throat swelling, SOB or lightheadedness with hypotension: Yes Has patient had a PCN reaction causing severe rash involving mucus membranes or skin necrosis: No Has patient had a PCN reaction that required hospitalization No Has patient had a PCN reaction occurring within the last 10 years: Yes - was taken 5 years ago  If all of the above answers are "NO", then may proceed with Cephalosporin use.     Outpatient Medications Prior to Visit  Medication Sig Dispense Refill  . albuterol (PROVENTIL HFA;VENTOLIN HFA) 108 (90 Base) MCG/ACT inhaler Inhale 2 puffs into the lungs every 6 (six) hours as needed for wheezing or shortness of breath. 1 Inhaler 0  . Prenatal Vit-Fe Fumarate-FA (PRENATAL VITAMINS PLUS)  27-1 MG TABS Take 1 tablet by mouth daily. 30 tablet 11  . etonogestrel (NEXPLANON) 68 MG IMPL implant 1 each by Subdermal route once. Reported on 10/07/2015    . metFORMIN (GLUCOPHAGE XR) 500 MG 24 hr tablet Take 1 tablet daily for 2 weeks, 2 tablets daily for 2 weeks and 3 tablets daily after that with dinner (Patient not taking: Reported on 10/07/2015) 90 tablet 1   No facility-administered medications prior to visit.      Patient Active Problem List   Diagnosis Date Noted  . Iron deficiency anemia 02/19/2014  . Pre-diabetes 09/06/2013  . Seasonal allergies 07/24/2013  . Teenage mother 06/05/2013  . 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 Obesity (BMI 30-39.9) 06/05/2013  . Nevus 06/05/2013    Social History: Lives with husband and son. W  Confidentiality was discussed with the patient and if applicable, with caregiver as well.  The following portions of the patient's history were reviewed and updated as appropriate: allergies, current medications, past family history, past medical history, past social history and problem list.  Physical Exam:  Vitals:   11/05/15 1342  BP: 117/71  Pulse: 94  Weight: 276 lb 9.6 oz (125.5 kg)  Height: 5\' 4"  (1.626 m)   BP 117/71   Pulse 94   Ht 5\' 4"  (1.626 m)   Wt 276 lb 9.6 oz (125.5 kg)   LMP  (Within Months)   BMI 47.48 kg/m  Body mass index: body mass index is 47.48 kg/m. Blood pressure percentiles are 79 % systolic and 77 % diastolic based on NHBPEP's 4th Report. Blood pressure percentile targets: 90: 122/77, 95: 126/81, 99 +  5 mmHg: 138/93.  Wt Readings from Last 3 Encounters:  11/05/15 276 lb 9.6 oz (125.5 kg) (>99 %, Z > 2.33)*  10/07/15 273 lb (123.8 kg) (>99 %, Z > 2.33)*  07/16/15 271 lb 6.4 oz (123.1 kg) (>99 %, Z > 2.33)*   * Growth percentiles are based on CDC 2-20 Years data.    Physical Exam  Constitutional: She is oriented to person, place, and time. She appears well-developed and well-nourished. No distress.   Morbidly obese   Eyes: EOM are normal. Pupils are equal, round, and reactive to light. No scleral icterus.  Neck: Normal range of motion. Neck supple. No thyromegaly present.  Cardiovascular: Normal rate, regular rhythm, normal heart sounds and intact distal pulses.   No murmur heard. Pulmonary/Chest: Effort normal and breath sounds normal.  Abdominal: Soft. There is no tenderness. There is no guarding.  Musculoskeletal: Normal range of motion. She exhibits no edema or tenderness.  Lymphadenopathy:    She has no cervical adenopathy.  Neurological: She is alert and oriented to person, place, and time. No cranial nerve deficit.  Skin: Skin is warm and dry. No rash noted.  Psychiatric: She has a normal mood and affect.  Nursing note and vitals reviewed.   Assessment/Plan: 1. Pregnancy examination or test, positive result -reviewed pregnancy test results -advised to seek prenatal care at [facility] or [facility]  -will need close monitoring due to prediabetes  - POCT urine pregnancy   Follow-up:  Return as needed. .   Medical decision-making:  >15 minutes spent face to face with patient with more than 50% of appointment spent discussing diagnosis, management, follow-up, and reviewing the plan of care as noted above.

## 2015-12-29 ENCOUNTER — Other Ambulatory Visit: Payer: Self-pay | Admitting: Pediatrics

## 2015-12-29 DIAGNOSIS — J45990 Exercise induced bronchospasm: Secondary | ICD-10-CM

## 2016-05-02 ENCOUNTER — Other Ambulatory Visit: Payer: Self-pay | Admitting: Pediatrics

## 2016-05-02 DIAGNOSIS — J45990 Exercise induced bronchospasm: Secondary | ICD-10-CM

## 2016-07-27 ENCOUNTER — Ambulatory Visit: Payer: Medicaid Other | Admitting: Pediatrics

## 2016-07-28 ENCOUNTER — Telehealth: Payer: Self-pay

## 2016-07-28 ENCOUNTER — Other Ambulatory Visit: Payer: Self-pay | Admitting: Pediatrics

## 2016-07-28 MED ORDER — CETIRIZINE HCL 10 MG PO TABS: 10.0000 mg | ORAL_TABLET | Freq: Every day | ORAL | 11 refills | Status: DC

## 2016-07-28 MED ORDER — LEVONORGESTREL 1.5 MG PO TABS: 1.5000 mg | ORAL_TABLET | Freq: Once | ORAL | 0 refills | Status: AC

## 2016-07-28 MED ORDER — LEVONORGESTREL 1.5 MG PO TABS: 1.5000 mg | ORAL_TABLET | Freq: Once | ORAL | 0 refills | Status: DC

## 2016-07-28 NOTE — Telephone Encounter (Signed)
Pt called stating she would like contraceptive and has been experiencing allergy symptoms and would like to know what to take considering she is breastfeeding. Gave her supportive care options and consulted Dortha Schwalbe who will send requested medication to pharmacy on file. Appointment made for tomorrow morning to address contraception and prefers Nexplanon.

## 2016-07-29 ENCOUNTER — Encounter: Payer: Self-pay | Admitting: Pediatrics

## 2016-07-29 ENCOUNTER — Ambulatory Visit (INDEPENDENT_AMBULATORY_CARE_PROVIDER_SITE_OTHER): Payer: Medicaid Other | Admitting: Pediatrics

## 2016-07-29 VITALS — BP 111/79 | HR 99 | Ht 64.57 in | Wt 250.4 lb

## 2016-07-29 DIAGNOSIS — Z30017 Encounter for initial prescription of implantable subdermal contraceptive: Secondary | ICD-10-CM

## 2016-07-29 DIAGNOSIS — E669 Obesity, unspecified: Secondary | ICD-10-CM | POA: Diagnosis not present

## 2016-07-29 DIAGNOSIS — Z3202 Encounter for pregnancy test, result negative: Secondary | ICD-10-CM

## 2016-07-29 DIAGNOSIS — Z113 Encounter for screening for infections with a predominantly sexual mode of transmission: Secondary | ICD-10-CM

## 2016-07-29 LAB — COMPREHENSIVE METABOLIC PANEL
ALBUMIN: 4.1 g/dL (ref 3.6–5.1)
ALT: 18 U/L (ref 6–29)
AST: 17 U/L (ref 10–30)
Alkaline Phosphatase: 99 U/L (ref 33–115)
BUN: 12 mg/dL (ref 7–25)
CALCIUM: 9.1 mg/dL (ref 8.6–10.2)
CHLORIDE: 106 mmol/L (ref 98–110)
CO2: 17 mmol/L — AB (ref 20–31)
CREATININE: 0.65 mg/dL (ref 0.50–1.10)
GLUCOSE: 78 mg/dL (ref 65–99)
Potassium: 4.3 mmol/L (ref 3.5–5.3)
SODIUM: 137 mmol/L (ref 135–146)
Total Bilirubin: 0.2 mg/dL (ref 0.2–1.2)
Total Protein: 7 g/dL (ref 6.1–8.1)

## 2016-07-29 LAB — CBC WITH DIFFERENTIAL/PLATELET
BASOS PCT: 0 %
Basophils Absolute: 0 cells/uL (ref 0–200)
EOS ABS: 1078 {cells}/uL — AB (ref 15–500)
Eosinophils Relative: 14 %
HEMATOCRIT: 39.7 % (ref 35.0–45.0)
Hemoglobin: 12.9 g/dL (ref 11.7–15.5)
LYMPHS ABS: 1771 {cells}/uL (ref 850–3900)
Lymphocytes Relative: 23 %
MCH: 25.5 pg — ABNORMAL LOW (ref 27.0–33.0)
MCHC: 32.5 g/dL (ref 32.0–36.0)
MCV: 78.5 fL — AB (ref 80.0–100.0)
MONO ABS: 693 {cells}/uL (ref 200–950)
MPV: 10.6 fL (ref 7.5–12.5)
Monocytes Relative: 9 %
Neutro Abs: 4158 cells/uL (ref 1500–7800)
Neutrophils Relative %: 54 %
Platelets: 285 10*3/uL (ref 140–400)
RBC: 5.06 MIL/uL (ref 3.80–5.10)
RDW: 16.1 % — AB (ref 11.0–15.0)
WBC: 7.7 10*3/uL (ref 3.8–10.8)

## 2016-07-29 LAB — LIPID PANEL
CHOLESTEROL: 197 mg/dL (ref <200)
HDL: 50 mg/dL — ABNORMAL LOW (ref >50)
LDL Cholesterol: 126 mg/dL — ABNORMAL HIGH (ref <100)
Total CHOL/HDL Ratio: 3.9 Ratio (ref <5.0)
Triglycerides: 107 mg/dL (ref <150)
VLDL: 21 mg/dL (ref <30)

## 2016-07-29 LAB — POCT URINE PREGNANCY: Preg Test, Ur: NEGATIVE

## 2016-07-29 MED ORDER — ETONOGESTREL 68 MG ~~LOC~~ IMPL
68.0000 mg | DRUG_IMPLANT | Freq: Once | SUBCUTANEOUS | Status: AC
  Administered 2016-07-29: 68 mg via SUBCUTANEOUS

## 2016-07-29 NOTE — Progress Notes (Signed)
THIS RECORD MAY CONTAIN CONFIDENTIAL INFORMATION THAT SHOULD NOT BE RELEASED WITHOUT REVIEW OF THE SERVICE PROVIDER.  Adolescent Medicine Consultation Follow-Up Visit Donna [name]  is a 21 y.o. female referred by Karlene Einstein, MD here today for follow-up regarding nexplanon placement. Patient would also want to be screened for diabetes.     Last seen in [facility] on 11/05/15 for positive pregnancy test. Since then she has had a healthy baby girl who is now 49 weeks old.   - Pertinent Labs? Yes - Growth Chart Viewed? yes   History was provided by the patient.  PCP Confirmed?  Doesn't have one   Enter confidential phone number in Family Comments section of [employer]  Chief Complaint  Patient presents with  . Follow-up    wants nexplanon and would like testing to see if she is diabetic   HPI:    Mom says she is currently breastfeeding and that things are going really well. She failed both the 1 hr and 3 hr glucose tolerance test, being diagnosed with gestational diabetes and was managed with diet.   Patient says she has changed her diet a lot when told had pre diabetes before pregnancy. She has been currently walking 2 days a week with husband for 30 -40 mins each time and she wants to restart the gym when new baby is 76 weeks old - plan is to go 3-4 times a week   No LMP recorded. Allergies  Allergen Reactions  . Penicillins Swelling    Swelling of the face  Has patient had a PCN reaction causing immediate rash, facial/tongue/throat swelling, SOB or lightheadedness with hypotension: Yes Has patient had a PCN reaction causing severe rash involving mucus membranes or skin necrosis: No Has patient had a PCN reaction that required hospitalization No Has patient had a PCN reaction occurring within the last 10 years: Yes - was taken 5 years ago  If all of the above answers are "NO", then may proceed with Cephalosporin use.     Outpatient Medications Prior to Visit   Medication Sig Dispense Refill  . albuterol (PROVENTIL HFA;VENTOLIN HFA) 108 (90 Base) MCG/ACT inhaler Inhale 2 puffs into the lungs every 6 (six) hours as needed for wheezing or shortness of breath. 1 Inhaler 0  . cetirizine (ZYRTEC) 10 MG tablet Take 1 tablet (10 mg total) by mouth daily. 30 tablet 11  . Prenatal Vit-Fe Fumarate-FA (PRENATAL VITAMINS PLUS) 27-1 MG TABS Take 1 tablet by mouth daily. 30 tablet 11  . etonogestrel (NEXPLANON) 68 MG IMPL implant 1 each by Subdermal route once. Reported on 10/07/2015    . metFORMIN (GLUCOPHAGE XR) 500 MG 24 hr tablet Take 1 tablet daily for 2 weeks, 2 tablets daily for 2 weeks and 3 tablets daily after that with dinner (Patient not taking: Reported on 10/07/2015) 90 tablet 1   No facility-administered medications prior to visit.      Patient Active Problem List   Diagnosis Date Noted  . Iron deficiency anemia 02/19/2014  . Pre-diabetes 09/06/2013  . Seasonal allergies 07/24/2013  . Teenage mother 06/05/2013  . 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 Obesity (BMI 30-39.9) 06/05/2013  . Nevus 06/05/2013    Social History: Lives with: husband, 2 children. Husband is a Curator.  School: [employer] in [place] - going to wait until new baby is 66 months old to go back/in the fall - wants to be a Psychologist, sport and exercise  Sleep:  States she is sleeping well at night  Confidentiality was discussed with the patient and if applicable, with caregiver as well.  Tobacco?  no Drugs/ETOH?  no Pregnancy Prevention: patient had unprotected sex yesterday. Plan B sent to the pharmacy and she has taken it. Would like condoms.  Suicidal or Self-Harm thoughts?   no  The following portions of the patient's history were reviewed and updated as appropriate: current medications, past medical history, past social history and past surgical history.  Physical Exam:  Vitals:   07/29/16 0854 07/29/16 0939  BP: (!) 139/93 111/79  Pulse:  99  Weight: 250  lb 6.4 oz (113.6 kg)   Height: 5' 4.57" (1.64 m)    BP 111/79 (BP Location: Right Arm, Patient Position: Sitting, Cuff Size: Large)   Pulse 99   Ht 5' 4.57" (1.64 m)   Wt 250 lb 6.4 oz (113.6 kg)   BMI 42.23 kg/m  Body mass index: body mass index is 42.23 kg/m. Growth percentile SmartLinks can only be used for patients less than 76 years old.  Physical Exam   Gen:  Well-appearing, in no acute distress. Very pleasant. Obese.  HEENT:  Normocephalic, atraumatic. EOMI. Oropharynx clear. MMM. Neck supple, no lymphadenopathy.   CV: Regular rate and rhythm, no murmurs rubs or gallops. PULM: Clear to auscultation bilaterally. No wheezes/rales or rhonchi EXT: Well perfused, capillary refill < 3sec. Neuro: Grossly intact. No neurologic focalization.  Skin: Warm, dry, no rashes  Nexplanon Insertion  No contraindications for placement.  No liver disease, no unexplained vaginal bleeding, no h/o breast cancer, no h/o blood clots.  Last Unprotected sex:  07/29/16  Risks & benefits of Nexplanon discussed The nexplanon device was purchased and supplied by [facility]. Packaging instructions supplied to patient Consent form signed  The patient denies any allergies to anesthetics or antiseptics.  Procedure: Pt was placed in supine position. The left arm was flexed at the elbow and externally rotated so that her wrist was parallel to her ear The medial epicondyle of the left arm was identified The insertions site was marked 8 cm proximal to the medial epicondyle The insertion site was cleaned with Betadine The area surrounding the insertion site was covered with a sterile drape 1% lidocaine was injected just under the skin at the insertion site extending 4 cm proximally. The sterile preloaded disposable Nexaplanon applicator was removed from the sterile packaging The applicator needle was inserted at a 30 degree angle at 8 cm proximal to the medial epicondyle as marked The applicator was lowered  to a horizontal position and advanced just under the skin for the full length of the needle The slider on the applicator was retracted fully while the applicator remained in the same position, then the applicator was removed. The implant was confirmed via palpation as being in position The implant position was demonstrated to the patient Pressure dressing was applied to the patient.  The patient was instructed to removed the pressure dressing in 24 hrs.  The patient was advised to move slowly from a supine to an upright position  The patient denied any concerns or complaints  The patient was instructed to schedule a follow-up appt in 1 month and to call sooner if any concerns.  The patient acknowledged agreement and understanding of the plan.   Assessment/Plan:  1. Insertion of implantable subdermal contraceptive Patient tolerated procedure well, this is her second one  - etonogestrel (NEXPLANON) implant 68 mg; 68 mg by Subdermal route once. - Subdermal Etonogestrel Implant Insertion  2. Routine screening for STI (  sexually transmitted infection) Performed below  - GC/Chlamydia Probe Amp Last HIV in our system 07/16/15 - NR Last RPR in our system 09/25/12 - NR (but likely had both done during pregnancy)   3. Obesity (BMI 30-39.9) Patient was previously on metformin, no longer taking. She is doing well with BF, diet control and exercise. Will draw the labs below. Told her to keep up the good work.  - Lipid panel - Hemoglobin A1c - VITAMIN D 25 Hydroxy (Vit-D Deficiency, Fractures) - CBC with Differential/Platelet - Comprehensive metabolic panel  FU PRN (due to this being second Nexplanon), going to start looking for care in [facility].  Guerry Minors, M.D. [facility] PGY-3

## 2016-07-29 NOTE — Addendum Note (Signed)
Addended by: Francie Massing on: 07/29/2016 10:15 AM   Modules accepted: Orders

## 2016-07-29 NOTE — Patient Instructions (Signed)
° °  Congratulations on getting your Nexplanon placement!  Below is some important information about Nexplanon. ° °First remember that Nexplanon does not prevent sexually transmitted infections.  Condoms will help prevent sexually transmitted infections. °The Nexplanon starts working 7 days after it was inserted.  There is a risk of getting pregnant if you have unprotected sex in those first 7 days after placement of the Nexplanon. ° °The Nexplanon lasts for 3 years but can be removed at any time.  You can become pregnant as early as 1 week after removal.  You can have a new Nexplanon put in after the old one is removed if you like. ° °It is not known whether Nexplanon is as effective in women who are very overweight because the studies did not include many overweight women. ° °Nexplanon interacts with some medications, including barbiturates, bosentan, carbamazepine, felbamate, griseofulvin, oxcarbazepine, phenytoin, rifampin, St. John's wort, topiramate, HIV medicines.  Please alert your doctor if you are on any of these medicines. ° °Always tell other healthcare providers that you have a Nexplanon in your arm. ° °The Nexplanon was placed just under the skin.  Leave the outside bandage on for 24 hours.  Leave the smaller bandage on for 3-5 days or until it falls off on its own.  Keep the area clean and dry for 3-5 days. °There is usually bruising or swelling at the insertion site for a few days to a week after placement.  If you see redness or pus draining from the insertion site, call us immediately. ° °Keep your user card with the date the implant was placed and the date the implant is to be removed. ° °The most common side effect is a change in your menstrual bleeding pattern.   This bleeding is generally not harmful to you but can be annoying.  Call or come in to see us if you have any concerns about the bleeding or if you have any side effects or questions.   ° °We will call you in 1 week to check in and we  would like you to return to the clinic for a follow-up visit in 1 month. ° °You can call [facility] 24 hours a day with any questions or concerns.  There is always a nurse or doctor available to take your call.  Call 9-1-1 if you have a life-threatening emergency.  For anything else, please call us at [phone] before heading to the ER. °

## 2016-07-30 LAB — GC/CHLAMYDIA PROBE AMP
CT Probe RNA: NOT DETECTED
GC PROBE AMP APTIMA: NOT DETECTED

## 2016-07-30 LAB — VITAMIN D 25 HYDROXY (VIT D DEFICIENCY, FRACTURES): Vit D, 25-Hydroxy: 20 ng/mL — ABNORMAL LOW (ref 30–100)

## 2016-07-30 LAB — HEMOGLOBIN A1C
Hgb A1c MFr Bld: 5.4 % (ref <5.7)
Mean Plasma Glucose: 108 mg/dL

## 2016-08-20 HISTORY — PX: CHOLECYSTECTOMY, LAPAROSCOPIC: SHX56

## 2016-08-24 ENCOUNTER — Encounter: Payer: Self-pay | Admitting: Pediatrics

## 2016-08-24 ENCOUNTER — Ambulatory Visit (INDEPENDENT_AMBULATORY_CARE_PROVIDER_SITE_OTHER): Payer: Medicaid Other | Admitting: Pediatrics

## 2016-08-24 ENCOUNTER — Telehealth: Payer: Self-pay | Admitting: Pediatrics

## 2016-08-24 VITALS — Temp 98.0°F | Wt 256.4 lb

## 2016-08-24 DIAGNOSIS — K8020 Calculus of gallbladder without cholecystitis without obstruction: Secondary | ICD-10-CM | POA: Diagnosis not present

## 2016-08-24 NOTE — Progress Notes (Signed)
  Subjective:    Donna [name] is a 21 y.o. old female here for Abdominal Pain (Patient was diagnosed with gallstones, has paperwork; Patient would like paper prescriptions if anything is prescirbed) .    HPI  Donna [name] is a 21 year old female presenting to clinic with right upper quadrant abdominal pain. The pain is "crampy" and "severe". The pain radiates to the right side of her back. The pain first started 3 months ago. She had one episode of pain that lasted for 2 hours and resolved spontaneously. One month ago, she had another episode of pain that lasted 3 hours. Yesterday, she had an episode of pain that lasted 5 hours. She then had another episode of pain that lasted 5 hours, so she went to the ED this morning. She vomited twice yesterday and twice this morning. The pain is worse after breastfeeding. The pain is not worse after eating. She has tried taking Ibuprofen 600mg , which has not helped. No fevers, no chills. Having regular BMs every day. No diarrhea, no constipation.  In the ED, she had a RUQ US done that showed physiologically distended gallbladder with multiple intraluminal gallstones. No gallbladder wall thickening. She was discharged with Percocet and Prilosec and was told to follow-up with her primary care doctor.  Review of Systems  Per HPI  History and Problem List: Donna [name] has Teenage mother; Exercise-induced bronchospasm; Obesity (BMI 30-39.9); Nevus; Seasonal allergies; Pre-diabetes; Iron deficiency anemia; and Symptomatic cholelithiasis on her problem list.  Donna [name]  has a past medical history of Asthma; Child in foster care (06/05/2013); and Vitamin D deficiency (09/06/2013).  Immunizations needed: none     Objective:    Temp 98 F (36.7 C) (Temporal)   Wt 256 lb 6.4 oz (116.3 kg)   BMI 43.24 kg/m  Physical Exam  Constitutional: She is oriented to person, place, and time. She appears well-developed and well-nourished.  Eyes: EOM are normal.  Neck: Normal range of motion. Neck  supple.  Cardiovascular: Normal rate.   Pulmonary/Chest: Effort normal.  Abdominal: Soft. Bowel sounds are normal.  Tenderness to palpation in the right upper quadrant. Negative Murphy's sign.  Musculoskeletal: Normal range of motion. She exhibits no edema.  Neurological: She is alert and oriented to person, place, and time.  Skin: Skin is warm and dry. No rash noted.  Psychiatric: She has a normal mood and affect. Thought content normal.      Assessment and Plan:     Donna [name] was seen today for Abdominal Pain (Patient was diagnosed with gallstones, has paperwork; Patient would like paper prescriptions if anything is prescirbed) .   Problem List Items Addressed This Visit      Digestive   Symptomatic cholelithiasis - Primary    Patient with episodes of RUQ pain after breastfeeding, likely related to gall bladder spasm due to release of oxytocin. Seen in the ED this morning and had RUQ US showing multiple gallstones without signs of cholecystitis. - Referral to General Surgery - Care transferred to [facility], now that patient is 21 years old - Appointment scheduled with new PCP 6/25 at 1:30pm for new patient appointment and post partum visit.      Relevant Orders   Ambulatory referral to General Surgery       Evette Doffing, MD

## 2016-08-24 NOTE — Telephone Encounter (Signed)
Reached patient who states OB office told her "they can not do anything about her" and to see her PCP here. appt was not cancelled.

## 2016-08-24 NOTE — Telephone Encounter (Signed)
I spoke with former foster mom Claire Shown and confirmed appointment for today, with Dr. Doneen Poisson. She will accompany Gertude to appointment and will bring ED discharge papers.

## 2016-08-24 NOTE — Telephone Encounter (Signed)
Patient seen in ED this morning with abdominal pain and called to make an afternoon appointment in our clinic.  Patient is 6 weeks post-partum and needs to see her OB-Gyn for this concern.  I called and left a VM for patient to call our clinic to speak with an RN about her concerns.  If patient calls back, please instruct her to call her OB-Gyn's office about these concerns and cancel her appointment for today.

## 2016-08-24 NOTE — Assessment & Plan Note (Signed)
Patient with episodes of RUQ pain after breastfeeding, likely related to gall bladder spasm due to release of oxytocin. Seen in the ED this morning and had RUQ US showing multiple gallstones without signs of cholecystitis. - Referral to General Surgery - Care transferred to [facility], now that patient is 21 years old - Appointment scheduled with new PCP 6/25 at 1:30pm for new patient appointment and post partum visit.

## 2016-08-24 NOTE — Patient Instructions (Addendum)
It was so nice to meet you!  We have referred you to General Surgery to discuss having your gallbladder removed.   I have scheduled you for an appointment in our clinic on June 25th at 1:30pm.  Our office is called [facility] and we are located at [place]. [address] [place], [place] [address].  Looking forward to seeing you! -Dr. Brett Albino

## 2016-08-27 ENCOUNTER — Telehealth: Payer: Self-pay

## 2016-08-27 ENCOUNTER — Other Ambulatory Visit: Payer: Self-pay | Admitting: Pediatrics

## 2016-08-27 NOTE — Telephone Encounter (Signed)
I called and spoke with patient and her mother about concerns.  Eye swelling is still present but improved from yesterday.  She has not been able to fill the prescriptions from the ER.  She does not have benadryl at home but does have cetirizine so I recommend that she take that now and try to fill the prednisone today.  For pain, I recommend that she take 800 mg ibuprofen every 8-12 hours as needed and avoid fatty foods.  OK to just maintain hydration with water and/or gatorade today until pain improves.

## 2016-08-27 NOTE — Telephone Encounter (Signed)
Pt went ER yesterday because her pain medication was not strong enough. While she was there she told the staff she was having an allergic reaction to it. They sent her home and when she got there her eyes were swollen and her toe was tight.  She went back to the ER. She doesn't know what to do and is looking for advice.

## 2016-08-30 ENCOUNTER — Telehealth: Payer: Self-pay

## 2016-08-30 NOTE — Telephone Encounter (Signed)
Per mom a referral is to be set up for [facility]. Her pain from her gallstones is increasing. Mother says that her insurance has been straightened out and needs to move forward.

## 2016-08-30 NOTE — Telephone Encounter (Signed)
Donna [name] left a message that her eyes were yellow and her urine was orange. Spoke with Dr. Abby Potash who recommended she go to the emergency room

## 2016-08-30 NOTE — Telephone Encounter (Signed)
Spoke to mother of  Pt. She is awaiting a call from [facility].

## 2016-08-31 DIAGNOSIS — K8020 Calculus of gallbladder without cholecystitis without obstruction: Secondary | ICD-10-CM | POA: Diagnosis not present

## 2016-08-31 DIAGNOSIS — R945 Abnormal results of liver function studies: Secondary | ICD-10-CM | POA: Diagnosis not present

## 2016-08-31 DIAGNOSIS — Z9049 Acquired absence of other specified parts of digestive tract: Secondary | ICD-10-CM | POA: Diagnosis not present

## 2016-09-13 ENCOUNTER — Ambulatory Visit (INDEPENDENT_AMBULATORY_CARE_PROVIDER_SITE_OTHER): Payer: Medicaid Other | Admitting: Internal Medicine

## 2016-09-13 ENCOUNTER — Encounter: Payer: Self-pay | Admitting: Internal Medicine

## 2016-09-13 ENCOUNTER — Ambulatory Visit: Payer: Self-pay | Admitting: Internal Medicine

## 2016-09-13 VITALS — BP 110/80 | HR 88 | Temp 98.1°F | Ht 65.0 in | Wt 261.0 lb

## 2016-09-13 DIAGNOSIS — Z7689 Persons encountering health services in other specified circumstances: Secondary | ICD-10-CM

## 2016-09-13 DIAGNOSIS — Z975 Presence of (intrauterine) contraceptive device: Secondary | ICD-10-CM | POA: Diagnosis not present

## 2016-09-13 NOTE — Progress Notes (Signed)
   [facility] Phone: [phone]  Subjective:  Melysa is a 21 year old female presenting to clinic for a new patient appointment.  ROS: No abdominal pain, no fevers, no chills, no vomiting, no shortness of breath  Past Medical History- asthma, seasonal allergies, prediabetes, obesity  Past Surgical History- cholecystecomy 2018  Medications- prenatal vitamin, Albuterol prn, nexplanon placed 07/29/16  Allergies- Percocet, Pencillins  Family history: none, mother and father are healthy with no medical problems  Social history- patient is a never smoker. Alcohol use very occasionally. No drug use.  Objective: BP 110/80 (BP Location: Right Arm, Patient Position: Sitting, Cuff Size: Normal)   Pulse 88   Temp 98.1 F (36.7 C) (Oral)   Ht 5\' 5"  (1.651 m)   Wt 261 lb (118.4 kg)   SpO2 97%   BMI 43.43 kg/m  Gen: NAD, alert, cooperative with exam HEENT: NCAT, EOMI, MMM Neck: FROM, supple CV: RRR, no murmur Resp: CTABL, no wheezes, normal work of breathing Msk: No edema, warm, normal tone, moves UE/LE spontaneously Neuro: Alert and oriented, no gross deficits Skin: No rashes, no lesions Psych: Appropriate behavior  Assessment/Plan: New patient appointment. Doing well. No concerns. - Follow-up in 6 months for PE and pap   Hyman Bible, MD PGY-2

## 2016-09-13 NOTE — Patient Instructions (Signed)
It was so nice to see you!  Please come back to see [place] in about 6 months for a pap smear. We can always see you back earlier if you have any concerns.  -Dr. Brett Albino

## 2016-10-11 ENCOUNTER — Encounter: Payer: Self-pay | Admitting: Internal Medicine

## 2016-10-11 ENCOUNTER — Ambulatory Visit (INDEPENDENT_AMBULATORY_CARE_PROVIDER_SITE_OTHER): Payer: Medicaid Other | Admitting: Internal Medicine

## 2016-10-11 DIAGNOSIS — H9201 Otalgia, right ear: Secondary | ICD-10-CM | POA: Insufficient documentation

## 2016-10-11 MED ORDER — FLUTICASONE PROPIONATE 50 MCG/ACT NA SUSP: 2.0000 | Freq: Every day | NASAL | 1 refills | Status: DC

## 2016-10-11 NOTE — Assessment & Plan Note (Signed)
Think this is likely secondary to eustachian tube dysfunction in the setting of a viral illness. No signs of acute otitis media on exam.  - Treat with Flonase 2 sprays per nostril daily until symptoms improve - Advised symptomatic care with ibuprofen and Tylenol as needed to reduce pain and fever of other things to do - Follow up if no improvement

## 2016-10-11 NOTE — Progress Notes (Signed)
   [facility] Phone: [phone]  Subjective:  Donna [name] is a 21 year old female presenting to clinic with right ear pain for one week. She states that she started having a sore throat a week ago and then noticed that her right ear was hurting. She has been feeling sick overall. She has had fevers to 103F. no congestion, no rhinorrhea, no shortness breath, no nausea, no vomiting. No sick contacts. No decreased hearing.  ROS: See HPI for pertinent positives and negatives  Past Medical History- exercise-induced asthma, seasonal allergies, prediabetes, obesity, iron deficiency anemia  Family history reviewed for today's visit. No changes.  Social history- patient is a never smoker  Objective: BP 122/70   Pulse 71   Temp 99 F (37.2 C) (Oral)   Ht 5\' 4"  (1.626 m)   Wt 269 lb (122 kg)   SpO2 99%   BMI 46.17 kg/m  Gen: NAD, alert, cooperative with exam HEENT: NCAT, EOMI, MMM, TMs normal, nasal turbinates erythematous and edematous, oropharynx erythematous without exudates. Neck: FROM, supple, no cervical lymphadenopathy  Assessment/Plan: Otalgia of right ear: Think this is likely secondary to eustachian tube dysfunction in the setting of a viral illness. No signs of acute otitis media on exam.  - Treat with Flonase 2 sprays per nostril daily until symptoms improve - Advised symptomatic care with ibuprofen and Tylenol as needed to reduce pain and fever of other things to do - Follow up if no improvement    Hyman Bible, MD PGY-3

## 2016-10-11 NOTE — Patient Instructions (Addendum)
It was so nice to see you!  I think your sinuses are inflamed and it is causing you to have a buildup of pressure behind your ear. I have prescribed a nose spray to help with this. Please use 2 sprays in each nostril daily until this gets better.  You can also use Tylenol and Ibuprofen to help with the pain and fevers.  If you are not getting better in the next 5-7 days, please come back to see [place]!  -Dr. Brett Albino

## 2016-10-12 ENCOUNTER — Other Ambulatory Visit: Payer: Self-pay | Admitting: *Deleted

## 2016-10-12 DIAGNOSIS — J45990 Exercise induced bronchospasm: Secondary | ICD-10-CM

## 2016-10-12 MED ORDER — ALBUTEROL SULFATE HFA 108 (90 BASE) MCG/ACT IN AERS: 2.0000 | INHALATION_SPRAY | Freq: Four times a day (QID) | RESPIRATORY_TRACT | 0 refills | Status: DC | PRN

## 2017-03-07 ENCOUNTER — Encounter: Payer: Self-pay | Admitting: Internal Medicine

## 2017-03-07 ENCOUNTER — Other Ambulatory Visit (HOSPITAL_COMMUNITY)
Admission: RE | Admit: 2017-03-07 | Discharge: 2017-03-07 | Disposition: A | Discharge status: Home / Self Care | Payer: Medicaid Other | Source: Ambulatory Visit | Attending: Family Medicine | Admitting: Family Medicine

## 2017-03-07 ENCOUNTER — Ambulatory Visit (INDEPENDENT_AMBULATORY_CARE_PROVIDER_SITE_OTHER): Payer: Medicaid Other | Admitting: Internal Medicine

## 2017-03-07 ENCOUNTER — Other Ambulatory Visit: Payer: Self-pay

## 2017-03-07 VITALS — BP 100/78 | HR 92 | Temp 98.1°F | Wt 286.0 lb

## 2017-03-07 DIAGNOSIS — Z124 Encounter for screening for malignant neoplasm of cervix: Secondary | ICD-10-CM | POA: Insufficient documentation

## 2017-03-07 DIAGNOSIS — R109 Unspecified abdominal pain: Secondary | ICD-10-CM | POA: Diagnosis present

## 2017-03-07 DIAGNOSIS — Z23 Encounter for immunization: Secondary | ICD-10-CM

## 2017-03-07 NOTE — Progress Notes (Signed)
   [facility] Phone: [phone]  Subjective:  Donna [name] is a 21 year old female presenting to clinic with lower abdominal cramping that started yesterday. She had the same type of cramping 3-4 weeks ago and also had a small amount of vaginal bleeding at that time. The cramping and abdominal bleeding resolved on their own. The cramping started again yesterday, but is mostly located on the right side now. She has not had any vaginal bleeding this time. No vaginal discharge, no itching, no dysuria, no fevers, no chills, no diarrhea, no constipation, no nausea, no vomiting. Of note, she stopped breastfeeding her baby 2 months ago. She is sexually active with one female partner. She had a Nexplanon placed 08/2016.  ROS: See HPI for pertinent positives and negatives  Past Medical History- obesity, seasonal allergies, prediabetes, iron deficiency anemia  Family history reviewed for today's visit. No changes.  Social history- patient is a never smoker  Objective: BP 100/78   Pulse 92   Temp 98.1 F (36.7 C) (Oral)   Wt 286 lb (129.7 kg)   LMP 02/14/2017   SpO2 98%   BMI 49.09 kg/m  Gen: NAD, alert, cooperative with exam GI: +BS, soft, non-tender, non-distended, no rebound, no guarding GU: External genitalia normal in appearance, vaginal walls normal, small amount of white vaginal discharge present, cervix normal, no cervical motion tenderness, no adnexal or uterine masses appreciated on bimanual exam.  Assessment/Plan: Abdominal Cramping: Think this is likely cramping related to her menstrual cycle restarting. She had the same cramping with a small amount of vaginal bleeding 3-4 weeks ago. She recently stopped breastfeeding 2 months ago and has not had a regular period since giving birth. PID is also on the differential, although patient denies vaginal discharge and does not have any cervical motion tenderness on exam. Ovarian cyst is also a possibility, given that her pain  is now more localized to the right side of her lower abdomen. No rebound/guarding, fevers, no severe abdominal pain to suggest appendicitis or other intra-abdominal infection. - Pap performed, as patient is due for this - Gonorrhea and chlamydia testing pending - Advised patient to use Ibuprofen and Tylenol as needed for pain - If abdominal cramping becomes more severe or occurs more frequently than monthly, patient should follow-up. Will likely need pelvic US at that time. - Precepted with attending.   Hyman Bible, MD PGY-3

## 2017-03-07 NOTE — Assessment & Plan Note (Addendum)
Think this is likely cramping related to her menstrual cycle restarting. She had the same cramping with a small amount of vaginal bleeding 3-4 weeks ago. She recently stopped breastfeeding 2 months ago and has not had a regular period since giving birth. PID is also on the differential, although patient denies vaginal discharge and does not have any cervical motion tenderness on exam. Ovarian cyst is also a possibility, given that her pain is now more localized to the right side of her lower abdomen. No rebound/guarding, fevers, no severe abdominal pain to suggest appendicitis or other intra-abdominal infection. - Pap performed, as patient is due for this - Gonorrhea and chlamydia testing pending - Advised patient to use Ibuprofen and Tylenol as needed for pain - If abdominal cramping becomes more severe or occurs more frequently than monthly, patient should follow-up. Will likely need pelvic US at that time. - Precepted with attending

## 2017-03-07 NOTE — Patient Instructions (Signed)
It was so nice to see you!  I think you may be restarting your period. If your pain worsens or if it is not better in the next 2 weeks, please call me and we can get a pelvic ultrasound.  I will call you with your pap results and lab results.  -Dr. Brett Albino

## 2017-03-09 ENCOUNTER — Telehealth: Payer: Self-pay | Admitting: Internal Medicine

## 2017-03-09 LAB — CYTOLOGY - PAP
Chlamydia: NEGATIVE
Diagnosis: NEGATIVE
Neisseria Gonorrhea: NEGATIVE
TRICH (WINDOWPATH): NEGATIVE

## 2017-03-09 NOTE — Telephone Encounter (Signed)
Called patient to let her know that her pap smear was normal. Patient voiced understanding.  Hyman Bible, MD PGY-3

## 2017-07-12 ENCOUNTER — Ambulatory Visit (HOSPITAL_COMMUNITY)
Admission: EM | Admit: 2017-07-12 | Discharge: 2017-07-12 | Disposition: A | Discharge status: Home / Self Care | Payer: Medicaid Other | Attending: Family Medicine | Admitting: Family Medicine

## 2017-07-12 ENCOUNTER — Encounter (HOSPITAL_COMMUNITY): Payer: Self-pay | Admitting: Emergency Medicine

## 2017-07-12 DIAGNOSIS — B349 Viral infection, unspecified: Secondary | ICD-10-CM

## 2017-07-12 DIAGNOSIS — J069 Acute upper respiratory infection, unspecified: Secondary | ICD-10-CM | POA: Diagnosis present

## 2017-07-12 DIAGNOSIS — Z885 Allergy status to narcotic agent status: Secondary | ICD-10-CM | POA: Insufficient documentation

## 2017-07-12 LAB — POCT RAPID STREP A: Streptococcus, Group A Screen (Direct): NEGATIVE

## 2017-07-12 MED ORDER — CETIRIZINE HCL 10 MG PO TABS: 10.0000 mg | ORAL_TABLET | Freq: Every day | ORAL | 0 refills | Status: DC

## 2017-07-12 MED ORDER — IPRATROPIUM BROMIDE 0.06 % NA SOLN: 2.0000 | Freq: Four times a day (QID) | NASAL | 0 refills | Status: DC

## 2017-07-12 MED ORDER — FLUTICASONE PROPIONATE 50 MCG/ACT NA SUSP: 2.0000 | Freq: Every day | NASAL | 0 refills | Status: DC

## 2017-07-12 MED ORDER — ONDANSETRON 4 MG PO TBDP: 4.0000 mg | ORAL_TABLET | Freq: Three times a day (TID) | ORAL | 0 refills | Status: DC | PRN

## 2017-07-12 NOTE — ED Triage Notes (Signed)
Pt sts URI sx with body aches

## 2017-07-12 NOTE — ED Provider Notes (Signed)
[facility]    CSN: [id number] Arrival date & time: 07/12/17  1430     History   Chief Complaint Chief Complaint  Patient presents with  . URI    HPI Donna [name] is a 22 y.o. female.   22 year old female comes in for 2 day history of URI symptoms. Has had rhinorrhea, nasal congestion, sore throat, fever, body aches. States fever with Tmax 102, has been taking tylenol with some relief. Has been eating and drinking though with nausea. Never smoker.      History reviewed. No pertinent past medical history.  There are no active problems to display for this patient.   History reviewed. No pertinent surgical history.  OB History   None      Home Medications    Prior to Admission medications   Medication Sig Start Date End Date Taking? Authorizing Provider  cetirizine (ZYRTEC) 10 MG tablet Take 1 tablet (10 mg total) by mouth daily. 07/12/17   Tasia Catchings, Amy V, PA-C  fluticasone (FLONASE) 50 MCG/ACT nasal spray Place 2 sprays into both nostrils daily. 07/12/17   Tasia Catchings, Amy V, PA-C  ipratropium (ATROVENT) 0.06 % nasal spray Place 2 sprays into both nostrils 4 (four) times daily. 07/12/17   Tasia Catchings, Amy V, PA-C  ondansetron (ZOFRAN ODT) 4 MG disintegrating tablet Take 1 tablet (4 mg total) by mouth every 8 (eight) hours as needed for nausea or vomiting. 07/12/17   Ok Edwards, PA-C    Family History History reviewed. No pertinent family history.  Social History Social History   Tobacco Use  . Smoking status: Never Smoker  . Smokeless tobacco: Never Used  Substance Use Topics  . Alcohol use: Never    Frequency: Never  . Drug use: Never     Allergies   Oxycodone   Review of Systems Review of Systems  Reason unable to perform ROS: See HPI as above.     Physical Exam Triage Vital Signs ED Triage Vitals [07/12/17 1458]  Enc Vitals Group     BP 124/80     Pulse Rate (!) 102     Resp 18     Temp 98.2 F (36.8 C)     Temp Source Oral     SpO2 98 %   Weight      Height      Head Circumference      Peak Flow      Pain Score      Pain Loc      Pain Edu?      Excl. in [place]?    No data found.  Updated Vital Signs BP 124/80 (BP Location: Right Arm)   Pulse (!) 102   Temp 98.2 F (36.8 C) (Oral)   Resp 18   SpO2 98%   Physical Exam  Constitutional: She is oriented to person, place, and time. She appears well-developed and well-nourished. No distress.  HENT:  Head: Normocephalic and atraumatic.  Right Ear: Tympanic membrane, external ear and ear canal normal. Tympanic membrane is not erythematous and not bulging.  Left Ear: Tympanic membrane, external ear and ear canal normal. Tympanic membrane is not erythematous and not bulging.  Nose: Mucosal edema and rhinorrhea present. Right sinus exhibits no maxillary sinus tenderness and no frontal sinus tenderness. Left sinus exhibits no maxillary sinus tenderness and no frontal sinus tenderness.  Mouth/Throat: Uvula is midline, oropharynx is clear and moist and mucous membranes are normal. No tonsillar exudate.  Eyes: Pupils are  equal, round, and reactive to light. Conjunctivae are normal.  Neck: Normal range of motion. Neck supple.  Cardiovascular: Normal rate, regular rhythm and normal heart sounds. Exam reveals no gallop and no friction rub.  No murmur heard. Pulmonary/Chest: Effort normal and breath sounds normal. She has no decreased breath sounds. She has no wheezes. She has no rhonchi. She has no rales.  Lymphadenopathy:    She has no cervical adenopathy.  Neurological: She is alert and oriented to person, place, and time.  Skin: Skin is warm and dry.  Psychiatric: She has a normal mood and affect. Her behavior is normal. Judgment normal.     UC Treatments / Results  Labs (all labs ordered are listed, but only abnormal results are displayed) Labs Reviewed  CULTURE, GROUP A STREP [facility])  POCT RAPID STREP A    EKG None Radiology No results found.  Procedures Procedures  (including critical care time)  Medications Ordered in UC Medications - No data to display   Initial Impression / Assessment and Plan / UC Course  I have reviewed the triage vital signs and the nursing notes.  Pertinent labs & imaging results that were available during my care of the patient were reviewed by me and considered in my medical decision making (see chart for details).    Rapid strep negative. Discussed with patient history and exam most consistent with viral URI. Symptomatic treatment as needed. Push fluids. Return precautions given.    Final Clinical Impressions(s) / UC Diagnoses   Final diagnoses:  Viral illness    ED Discharge Orders        Ordered    fluticasone (FLONASE) 50 MCG/ACT nasal spray  Daily     07/12/17 1530    ipratropium (ATROVENT) 0.06 % nasal spray  4 times daily     07/12/17 1530    cetirizine (ZYRTEC) 10 MG tablet  Daily     07/12/17 1530    ondansetron (ZOFRAN ODT) 4 MG disintegrating tablet  Every 8 hours PRN     07/12/17 1530        Ok Edwards, PA-C 07/12/17 1533

## 2017-07-12 NOTE — Discharge Instructions (Addendum)
Rapid strep negative. Tessalon for cough. Start flonase, atrovent nasal spray, zyrtec for nasal congestion/drainage. You can use over the counter nasal saline rinse such as neti pot for nasal congestion. Keep hydrated, your urine should be clear to pale yellow in color. Tylenol/motrin for fever and pain. Monitor for any worsening of symptoms, chest pain, shortness of breath, wheezing, swelling of the throat, follow up for reevaluation.   For sore throat try using a honey-based tea. Use 3 teaspoons of honey with juice squeezed from half lemon. Place shaved pieces of ginger into 1/2-1 cup of water and warm over stove top. Then mix the ingredients and repeat every 4 hours as needed.

## 2017-07-13 ENCOUNTER — Encounter: Payer: Self-pay | Admitting: Internal Medicine

## 2017-07-15 LAB — CULTURE, GROUP A STREP (THRC)

## 2018-01-06 DIAGNOSIS — F4322 Adjustment disorder with anxiety: Secondary | ICD-10-CM | POA: Diagnosis not present

## 2018-01-22 DIAGNOSIS — N939 Abnormal uterine and vaginal bleeding, unspecified: Secondary | ICD-10-CM | POA: Diagnosis not present

## 2018-01-22 DIAGNOSIS — D72829 Elevated white blood cell count, unspecified: Secondary | ICD-10-CM | POA: Diagnosis not present

## 2018-01-22 DIAGNOSIS — R110 Nausea: Secondary | ICD-10-CM | POA: Diagnosis not present

## 2018-01-22 DIAGNOSIS — Z3202 Encounter for pregnancy test, result negative: Secondary | ICD-10-CM | POA: Diagnosis not present

## 2018-01-22 DIAGNOSIS — R109 Unspecified abdominal pain: Secondary | ICD-10-CM | POA: Diagnosis not present

## 2018-01-22 DIAGNOSIS — R1084 Generalized abdominal pain: Secondary | ICD-10-CM | POA: Diagnosis not present

## 2018-01-22 DIAGNOSIS — R3915 Urgency of urination: Secondary | ICD-10-CM | POA: Diagnosis not present

## 2018-01-22 DIAGNOSIS — R300 Dysuria: Secondary | ICD-10-CM | POA: Diagnosis not present

## 2018-01-23 DIAGNOSIS — R109 Unspecified abdominal pain: Secondary | ICD-10-CM | POA: Diagnosis not present

## 2018-01-23 DIAGNOSIS — N939 Abnormal uterine and vaginal bleeding, unspecified: Secondary | ICD-10-CM | POA: Diagnosis not present

## 2018-02-15 ENCOUNTER — Encounter: Payer: Self-pay | Admitting: Family Medicine

## 2018-02-15 ENCOUNTER — Ambulatory Visit: Payer: Medicaid Other | Admitting: Family Medicine

## 2018-02-15 ENCOUNTER — Other Ambulatory Visit: Payer: Self-pay

## 2018-02-15 VITALS — BP 100/60 | HR 89 | Temp 98.1°F | Ht 64.0 in | Wt 306.0 lb

## 2018-02-15 DIAGNOSIS — N939 Abnormal uterine and vaginal bleeding, unspecified: Secondary | ICD-10-CM | POA: Insufficient documentation

## 2018-02-15 DIAGNOSIS — Z23 Encounter for immunization: Secondary | ICD-10-CM | POA: Insufficient documentation

## 2018-02-15 DIAGNOSIS — Z3202 Encounter for pregnancy test, result negative: Secondary | ICD-10-CM | POA: Diagnosis not present

## 2018-02-15 DIAGNOSIS — N946 Dysmenorrhea, unspecified: Secondary | ICD-10-CM | POA: Insufficient documentation

## 2018-02-15 LAB — POCT URINE PREGNANCY: PREG TEST UR: NEGATIVE

## 2018-02-15 MED ORDER — NORELGESTROMIN-ETH ESTRADIOL 150-35 MCG/24HR TD PTWK: 1.0000 | MEDICATED_PATCH | TRANSDERMAL | 4 refills | Status: DC

## 2018-02-15 NOTE — Assessment & Plan Note (Signed)
The patient received a flu shot 02/15/2018.

## 2018-02-15 NOTE — Patient Instructions (Signed)
Thank you for coming in to see [place] today! Please see below to review our plan for today's visit:  1.  Your Nexplanon device was removed today.  Expect some soreness and tenderness in the left arm as this site heals.  You should feel much better in a couple of days.  Do not take NSAIDs such as ibuprofen, Advil, or Midol to relieve the pain from the site as this can thin the blood and cause more bleeding.  Take Tylenol instead. 2.  You have been prescribed a birth control patch which is been sent to your pharmacy.  Use 1 patch weekly, changing it out every Sunday, for 3 weeks in a row.  Do not put the patch on for the fourth week to allow for your period to occur.  On the next Sunday, place another patch for continued coverage.  Repeat. 3.  We performed a urine pregnancy test today to make sure that you are not pregnant.  Patient is low for this.  If anything results we will call you would let you know excavation point  Please call the clinic at [phone] if your symptoms worsen or you have any concerns. It was our pleasure to serve you!  Dr. Milus Banister [facility]

## 2018-02-15 NOTE — Assessment & Plan Note (Signed)
Patient has experienced heavy, prolonged periods with her Nexplanon.  Will follow up with symptomatology in the future. -Nexplanon device removed -Patient prescribed contraceptive patch -Urine pregnancy test performed: Results negative -Patient counseled on the healing process of Nexplanon removal.

## 2018-02-15 NOTE — Progress Notes (Signed)
   Subjective:    Patient ID: Donna [name], female    DOB: 28-Jun-1995, 22 y.o.   MRN: [id number]   CC: Heavy, prolonged periods  HPI: The patient is presenting today to have her Nexplanon device removed and to be started on a patch for birth control.  She reports she has had the Nexplanon for about 12 months and she did not have periods for about 7 months.  They returned roughly 5 months ago, have been very irregular, and are now getting longer and longer, with her most recent one lasting 22 days.  She denies fatigue, shortness of breath, and lightheadedness.  Hemoglobin checked at another visit was normal at 13.1.  She also reports she has used IUD in the past and it came out twice.  Oral contraceptive pills made her sick to her stomach and often vomits.  She tried a Depo-Provera shot for 1 year and they caused her to gain a lot of weight.  Last period started October 15, stopped Nov 1st.  Smoking status reviewed: never smoker  Review of Systems  Constitutional: Negative for chills, fever and malaise/fatigue.  Gastrointestinal: Negative for abdominal pain, constipation, diarrhea, nausea and vomiting.  Genitourinary: Negative for dysuria.  Neurological: Negative for dizziness and weakness.   Objective:  BP 100/60   Pulse 89   Temp 98.1 F (36.7 C) (Oral)   Ht 5\' 4"  (1.626 m)   Wt (!) 306 lb (138.8 kg)   LMP 01/03/2018 (Exact Date)   SpO2 98%   BMI 52.52 kg/m  Vitals and nursing note reviewed  General: well nourished, in no acute distress HEENT: normocephalic, TM's visualized bilaterally, no scleral icterus or conjunctival pallor, no nasal discharge, moist mucous membranes, good dentition without erythema or discharge noted in posterior oropharynx Cardiac: RRR, clear S1 and S2, no murmurs, rubs, or gallops Respiratory: clear to auscultation bilaterally, no increased work of breathing Abdomen: soft, nontender, nondistended, no masses or organomegaly. Bowel sounds  present Extremities: no edema or cyanosis. Warm, well perfused. 2+ radial and PT pulses bilaterally  Assessment & Plan:    Abnormal uterine bleeding Patient has experienced heavy, prolonged periods with her Nexplanon.  Will follow up with symptomatology in the future. -Nexplanon device removed -Patient prescribed contraceptive patch -Urine pregnancy test performed: Results negative -Patient counseled on the healing process of Nexplanon removal.  Need for immunization against influenza The patient received a flu shot 02/15/2018.  Return if symptoms worsen or fail to improve.   Dr. Milus Banister [facility], PGY-1

## 2018-05-19 ENCOUNTER — Ambulatory Visit (INDEPENDENT_AMBULATORY_CARE_PROVIDER_SITE_OTHER): Payer: Medicaid Other | Admitting: Family Medicine

## 2018-05-19 ENCOUNTER — Other Ambulatory Visit: Payer: Self-pay

## 2018-05-19 VITALS — BP 134/88 | HR 111 | Temp 100.8°F | Ht 64.0 in | Wt 306.0 lb

## 2018-05-19 DIAGNOSIS — J45990 Exercise induced bronchospasm: Secondary | ICD-10-CM | POA: Diagnosis not present

## 2018-05-19 DIAGNOSIS — J020 Streptococcal pharyngitis: Secondary | ICD-10-CM | POA: Insufficient documentation

## 2018-05-19 LAB — POCT RAPID STREP A (OFFICE): RAPID STREP A SCREEN: POSITIVE — AB

## 2018-05-19 MED ORDER — ALBUTEROL SULFATE HFA 108 (90 BASE) MCG/ACT IN AERS: 2.0000 | INHALATION_SPRAY | Freq: Four times a day (QID) | RESPIRATORY_TRACT | 0 refills | Status: DC | PRN

## 2018-05-19 MED ORDER — CLINDAMYCIN HCL 300 MG PO CAPS: 300.0000 mg | ORAL_CAPSULE | Freq: Three times a day (TID) | ORAL | 0 refills | Status: DC

## 2018-05-19 NOTE — Patient Instructions (Addendum)
It was great meeting you today!  I am sorry about your sore throat.  You tested positive for strep throat.  Explained your sore throat and your symptoms.  Due to your penicillin allergy I sent in a medication called clindamycin.  You will take 3 times a day for 10 days.

## 2018-05-19 NOTE — Progress Notes (Signed)
   HPI 23 year old female who presents for 2-day history of fever, chills, myalgias, sore throat.  She also endorses some minor sinus pressure and ear pain.  She states that all of her other symptoms have gone away stop the sore throat.  She feels like her lymph nodes are swollen.  She states that she had a similar set of symptoms last year, she received a shot of some kind and her symptoms went away in 2 days.  This was done in [place] and there are no records from this.  Her main complaint is a sore throat.  She describes it as very very painful whenever she swallows and when she tries to sleep.  Point-of-care rapid strep performed in office which was positive for group A strep.  CC: Sore throat   ROS:  Review of Systems See HPI for ROS.   CC, SH/smoking status, and VS noted  Objective: BP 134/88   Pulse (!) 111   Temp (!) 100.8 F (38.2 C) (Axillary)   Ht 5\' 4"  (1.626 m)   Wt (!) 306 lb (138.8 kg)   SpO2 99%   BMI 52.52 kg/m  Gen: 23 year old female, resting comfortably, no acute distress HEENT: Purulent, ulcerative breakdown on bilateral tonsils noted.  Cervical lymphadenopathy present CV: RRR, no murmur Resp: CTAB, no wheezes, non-labored Abd: SNTND, BS present, no guarding or organomegaly Neuro: Alert and oriented, Speech clear, No gross deficits   Assessment and plan:  Exercise-induced bronchospasm Symptoms well controlled.  Refill albuterol.  Streptococcal sore throat History, symptoms consistent with strep and strep swab positive.  Patient has anaphylaxis type reaction to penicillins.  Upon review looks like she had never been trialed on cephalosporin.  Discussed with clinical pharmacist and decided on clindamycin.  Will do 300 mg 3 times daily for 10 days.  Reviewed lactation status, patient no longer breast-feeding her child.   Orders Placed This Encounter  Procedures  . Rapid Strep A    Meds ordered this encounter  Medications  . clindamycin (CLEOCIN) 300  MG capsule    Sig: Take 1 capsule (300 mg total) by mouth 3 (three) times daily.    Dispense:  30 capsule    Refill:  0  . albuterol (PROVENTIL HFA;VENTOLIN HFA) 108 (90 Base) MCG/ACT inhaler    Sig: Inhale 2 puffs into the lungs every 6 (six) hours as needed for wheezing or shortness of breath.    Dispense:  2 Inhaler    Refill:  0     Guadalupe Dawn MD PGY-2 Family Medicine Resident  05/19/2018 3:34 PM

## 2018-05-19 NOTE — Assessment & Plan Note (Signed)
History, symptoms consistent with strep and strep swab positive.  Patient has anaphylaxis type reaction to penicillins.  Upon review looks like she had never been trialed on cephalosporin.  Discussed with clinical pharmacist and decided on clindamycin.  Will do 300 mg 3 times daily for 10 days.  Reviewed lactation status, patient no longer breast-feeding her child.

## 2018-05-19 NOTE — Assessment & Plan Note (Signed)
Symptoms well controlled.  Refill albuterol.

## 2019-01-30 ENCOUNTER — Ambulatory Visit (INDEPENDENT_AMBULATORY_CARE_PROVIDER_SITE_OTHER): Payer: Medicaid Other | Admitting: Family Medicine

## 2019-01-30 ENCOUNTER — Encounter: Payer: Self-pay | Admitting: Family Medicine

## 2019-01-30 ENCOUNTER — Other Ambulatory Visit: Payer: Self-pay

## 2019-01-30 ENCOUNTER — Other Ambulatory Visit (HOSPITAL_COMMUNITY)
Admission: RE | Admit: 2019-01-30 | Discharge: 2019-01-30 | Disposition: A | Discharge status: Home / Self Care | Payer: Medicaid Other | Source: Ambulatory Visit | Attending: Family Medicine | Admitting: Family Medicine

## 2019-01-30 VITALS — BP 128/64 | HR 77

## 2019-01-30 DIAGNOSIS — Z30017 Encounter for initial prescription of implantable subdermal contraceptive: Secondary | ICD-10-CM | POA: Insufficient documentation

## 2019-01-30 DIAGNOSIS — F439 Reaction to severe stress, unspecified: Secondary | ICD-10-CM | POA: Insufficient documentation

## 2019-01-30 DIAGNOSIS — Z113 Encounter for screening for infections with a predominantly sexual mode of transmission: Secondary | ICD-10-CM | POA: Insufficient documentation

## 2019-01-30 DIAGNOSIS — Z23 Encounter for immunization: Secondary | ICD-10-CM

## 2019-01-30 DIAGNOSIS — Z308 Encounter for other contraceptive management: Secondary | ICD-10-CM | POA: Diagnosis present

## 2019-01-30 LAB — POCT URINE PREGNANCY: Preg Test, Ur: NEGATIVE

## 2019-01-30 MED ORDER — LEVONORGESTREL 1.5 MG PO TABS: 1.5000 mg | ORAL_TABLET | Freq: Once | ORAL | 0 refills | Status: AC

## 2019-01-30 MED ORDER — DROSPIRENONE-ETHINYL ESTRADIOL 3-0.02 MG PO TABS: 1.0000 | ORAL_TABLET | Freq: Every day | ORAL | 4 refills | Status: DC

## 2019-01-30 NOTE — Assessment & Plan Note (Signed)
Patient received flu vaccination today 01/30/2019

## 2019-01-30 NOTE — Assessment & Plan Note (Signed)
-  Patient decided to discontinue patches for contraceptive management, and would like to start on OCP.  Patient does not smoke, has no family history of clotting disorder, and has no history of migraine with and without aura.  Patient is a candidate for combination OCP. -Patient was instructed to take this medication every day around the same time. -Patient educated that OCPs do not prevent STIs and it is recommended for her to continue to use barrier protection during sexual activity.

## 2019-01-30 NOTE — Assessment & Plan Note (Signed)
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  Gad score 11/10 equals 13, PHQ-9 today is 10.  Patient denies any thoughts or concerns with hurting herself or hurting anyone else. -Patient is respectfully declining any sort of medication help today -Patient promises to follow-up with [place] if she feels the need to start any medication in managing her symptoms. -Patient plans to reach out to school counselor or [facility] for counseling/therapy needs as needed for her stress.

## 2019-01-30 NOTE — Progress Notes (Signed)
   Subjective:    Patient ID: Donna [name], female    DOB: 1995-05-01, 23 y.o.   MRN: [id number]   CC: Patient desires change in her contraception  HPI: Patient is a pleasant 23 year old female presenting for follow-up for contraception.  Encounter for contraceptive counseling: The patient has been on patch for birth control, but would like to switch to an oral contraceptive pill.  Patient was educated that a pill must be taken every day and may not have the same effectiveness as other forms of birth control including patch, IUD, and Nexplanon.  The patient denies having any history of migraines with or without aura, smoking history, or personal or family history of clotting disorder.  Additionally, patient reports that she and her partner "had an accident" when a condom broke during sexual activity yesterday 01/29/2019.  She is asking for a prescription for Plan B.  Pregnancy test today is negative.  Stress: Patient filled out generalized anxiety disorder survey and scored a 13, expressing that she often worries too much about different things or becomes easily annoyed or irritable.  Patient reports that this most likely has to do with recent changes in Covid, keeping up with her own schooling, as well as taking care of her children schooling at home.  She is oftentimes overwhelmed, but denies any thoughts about hurting herself, hurting anyone else.  The patient is a Ship broker at [employer].  She believes there is a student health center there where she can have access to counseling.  She plans to reach out to them for a counselor.  She does not desire to start any medication treatment for anxiety today.  Smoking status reviewed - non-smoker  Review of Systems - see HPI   Objective:  BP 128/64   Pulse 77   LMP 12/28/2018 (Exact Date)   SpO2 97%  Vitals and nursing note reviewed  General: well nourished, in no acute distress, pleasant patient Cardiac: RRR, clear S1 and S2, no murmurs  appreciated Respiratory: CTA bilaterally, normal work of breathing Abdomen: Normal bowel sounds appreciated, nondistended  Skin: warm and dry, no rashes noted Neuro: no focal deficits  Assessment & Plan:   Need for immunization against influenza Patient received flu vaccination today 01/30/2019  Encounter for other contraceptive management -Patient decided to discontinue patches for contraceptive management, and would like to start on OCP.  Patient does not smoke, has no family history of clotting disorder, and has no history of migraine with and without aura.  Patient is a candidate for combination OCP. -Patient was instructed to take this medication every day around the same time. -Patient educated that OCPs do not prevent STIs and it is recommended for her to continue to use barrier protection during sexual activity.  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  Gad score 11/10 equals 13, PHQ-9 today is 10.  Patient denies any thoughts or concerns with hurting herself or hurting anyone else. -Patient is respectfully declining any sort of medication help today -Patient promises to follow-up with [place] if she feels the need to start any medication in managing her symptoms. -Patient plans to reach out to school counselor or [place] for counseling/therapy needs as needed for her stress.   Return if symptoms worsen or fail to improve.   Dr. Milus Banister [facility], PGY-2

## 2019-01-31 LAB — URINE CYTOLOGY ANCILLARY ONLY
Chlamydia: NEGATIVE
Comment: NEGATIVE
Comment: NEGATIVE
Comment: NORMAL
Neisseria Gonorrhea: NEGATIVE
Trichomonas: NEGATIVE

## 2019-02-28 ENCOUNTER — Other Ambulatory Visit: Payer: Self-pay

## 2019-02-28 ENCOUNTER — Ambulatory Visit: Payer: Medicaid Other | Admitting: Family Medicine

## 2019-02-28 ENCOUNTER — Encounter: Payer: Self-pay | Admitting: Family Medicine

## 2019-02-28 VITALS — HR 94

## 2019-02-28 DIAGNOSIS — Z3201 Encounter for pregnancy test, result positive: Secondary | ICD-10-CM | POA: Diagnosis not present

## 2019-02-28 DIAGNOSIS — Z3200 Encounter for pregnancy test, result unknown: Secondary | ICD-10-CM

## 2019-02-28 LAB — POCT URINE PREGNANCY: Preg Test, Ur: POSITIVE — AB

## 2019-02-28 NOTE — Progress Notes (Signed)
  Subjective:  Patient ID: Donna [name]  DOB: Aug 01, 1995 MRN: [id number]  Donna [name] is a 23 y.o. female with a PMH of prediabetes, AUB, allergies, here today for concern for pregnancy.   HPI:  Concern for pregnancy: - Previously on patch for birth control, but was going to switch to an OCP, which she never started as she did not have her period.  - Last period she has had was 10/22 - Patient reports that she and her partner "had an accident" when a condom broke during sexual activity 01/29/2019, she was sen in the office on 11/10 and took planB. Pregnancy test 11/10 was negative. - She reports she took 2 pregnancy tests at home that were positive. She does not want to pregnant now because she is trying to finish school and has 2 children now. She has not been taking prenatals. She previously has had no complications during regnancy and received OB/GYN care in [place].  - she is not having any N/V/abdominal pain or vaginal discharge/bleeding.  - she reports that she has not thought about not keeping the baby because she is a christian but she would like resources just to discuss with her family. She says she has a support system at home.    ROS: As mentioned in HPI  Smoking status reviewed  Patient Active Problem List   Diagnosis Date Noted  . Encounter for pregnancy test 03/01/2019  . Encounter for other contraceptive management 01/30/2019  . Stress 01/30/2019  . Abnormal uterine bleeding 02/15/2018  . Iron deficiency anemia 02/19/2014  . Pre-diabetes 09/06/2013  . Seasonal allergies 07/24/2013  . 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 Obesity (BMI 30-39.9) 06/05/2013     Objective:  Pulse 94   LMP 01/08/2019 (Exact Date)   Vitals and nursing note reviewed  General: NAD, pleasant Pulm: normal effort Extremities: no edema or cyanosis. WWP. Skin: warm and dry, no rashes noted Neuro: alert and oriented, no focal deficits Psych: normal affect, normal  thought content, tearful  Assessment & Plan:   Encounter for pregnancy test Patient given handout for resources to investigate terminating pregnancy but encouraged to discuss with her family and support system. She is also established at [facility] for OB/GYN care and encouraged to schedule follow up with them. Given prenatal vitamins today for her to take. Patient has 2 other children at home and is in school and would like to discuss all options. Previous pregnancies have been uncomplicated. All questions answered and patient provided resources for counseling services as well.    Donna Doshie Maggi, DO Family Medicine Resident PGY-3

## 2019-02-28 NOTE — Patient Instructions (Addendum)
Thank you for coming to see me today. It was a pleasure! Today we talked about:   Your pregnancy test is positive. Please follow up with your [facility] for labs and. I have sent a prenatal vitamin to your pharmacy. If you would like to have any further discussions regarding continuing this pregnancy, then please let me know and we can provide you with resources.   If you have any questions or concerns, please do not hesitate to call the office at [phone].  Take Care,   [place] Javona Bergevin, DO

## 2019-03-01 DIAGNOSIS — Z3200 Encounter for pregnancy test, result unknown: Secondary | ICD-10-CM | POA: Insufficient documentation

## 2019-03-01 NOTE — Assessment & Plan Note (Signed)
Patient given handout for resources to investigate terminating pregnancy but encouraged to discuss with her family and support system. She is also established at [facility] for OB/GYN care and encouraged to schedule follow up with them. Given prenatal vitamins today for her to take. Patient has 2 other children at home and is in school and would like to discuss all options. Previous pregnancies have been uncomplicated. All questions answered and patient provided resources for counseling services as well.

## 2019-07-19 ENCOUNTER — Other Ambulatory Visit: Payer: Self-pay

## 2019-07-19 MED ORDER — CETIRIZINE HCL 10 MG PO TABS: 10.0000 mg | ORAL_TABLET | Freq: Every day | ORAL | 0 refills | Status: DC

## 2019-07-19 MED ORDER — FLUTICASONE PROPIONATE 50 MCG/ACT NA SUSP: 2.0000 | Freq: Every day | NASAL | 0 refills | Status: DC

## 2019-07-20 ENCOUNTER — Other Ambulatory Visit: Payer: Self-pay | Admitting: Family Medicine

## 2019-07-20 DIAGNOSIS — J302 Other seasonal allergic rhinitis: Secondary | ICD-10-CM

## 2019-07-20 MED ORDER — CETIRIZINE HCL 10 MG PO TABS: 10.0000 mg | ORAL_TABLET | Freq: Every day | ORAL | 3 refills | Status: DC

## 2019-07-20 MED ORDER — FLUTICASONE PROPIONATE 50 MCG/ACT NA SUSP: 2.0000 | Freq: Every day | NASAL | 3 refills | Status: DC

## 2019-07-23 ENCOUNTER — Other Ambulatory Visit: Payer: Self-pay

## 2019-07-23 MED ORDER — ALBUTEROL SULFATE HFA 108 (90 BASE) MCG/ACT IN AERS: 2.0000 | INHALATION_SPRAY | Freq: Four times a day (QID) | RESPIRATORY_TRACT | 2 refills | Status: DC | PRN

## 2019-10-09 ENCOUNTER — Other Ambulatory Visit: Payer: Self-pay | Admitting: *Deleted

## 2019-10-10 ENCOUNTER — Telehealth (INDEPENDENT_AMBULATORY_CARE_PROVIDER_SITE_OTHER): Payer: Medicaid Other | Admitting: Family Medicine

## 2019-10-10 DIAGNOSIS — Z308 Encounter for other contraceptive management: Secondary | ICD-10-CM

## 2019-10-10 NOTE — Assessment & Plan Note (Addendum)
Patient with plan to get bypass surgery which recommendation to transition from OCPs to alternative form given less absorption after the surgery. Discussed birth control options. She would like to transition to Donna [name].  - Nexplanon insertion scheduled for 10/17/19 with Dr. Tarry Kos - Urine pregnancy test at time of exam - Post-insertion protection: counseled that she can continue her birth control pills for 1 week after insertion or wear condoms to protect her self from pregnancy. She opted to continue birth control pill for 1 week until her nexplanon becomes effective.

## 2019-10-10 NOTE — Progress Notes (Signed)
[place] Telemedicine Visit  I connected with Donna [name] on 10/10/19 at  1:30 PM EDT by a video enabled telemedicine application and verified that I am speaking with the correct person using two identifiers.     I discussed the limitations of evaluation and management by telemedicine and the availability of in person appointments.  I discussed that the purpose of this telehealth visit is to provide medical care while limiting exposure to the novel coronavirus.  The patient expressed understanding and agreed to proceed.  Patient consented to have virtual visit. Method of visit: Video  Encounter participants: Patient: Donna [name] - located at home Provider: Danna Hefty - located at [facility] Others (if applicable): None  Chief Complaint: birth control counseling   HPI:  Encounter for Contraception Management: Patient is currently on [place] birth control pills. She is interested in nexplanon. She has had this before and liked it. She does not that he had abnormal periods with this but she is okay with this. She has had the IUD in the past and they both fell out each time thus does not want to do that again. She is enjoying her birth control pills but she is planning on having the gastric bypass surgery and it was recommended that she find an alternative form of contraception in preparation of the surgery to help prevent surgery.   LMP ~09/07/19 that lasted about 2 days. Normally her periods last about 3 days with moderate flow. Mild dysmenorrhea throughout periods.  Sexually active with one female sexual partner. Has not missed any dosages of her birth control.   ROS: per HPI  Pertinent PMHx: G2P2   Exam:  Gen: well appearing young female, sitting comfortably on couch Respiratory: breathing comfortably on room air, speaking in full sentences  Assessment/Plan:  Encounter for other contraceptive management Patient with plan to get bypass surgery  which recommendation to transition from OCPs to alternative form given less absorption after the surgery. Discussed birth control options. She would like to transition to [place].  - Nexplanon insertion scheduled for 10/17/19 with Dr. Tarry Kos - Urine pregnancy test at time of exam - Post-insertion protection: counseled that she can continue her birth control pills for 1 week after insertion or wear condoms to protect her self from pregnancy. She opted to continue birth control pill for 1 week until her nexplanon becomes effective.     I discussed the assessment and treatment plan with the patient and/or parent/guardian. They were provided an opportunity to ask questions and all were answered. They agreed with the plan and demonstrated an understanding of the instructions.   They were advised to call back or seek an in-person evaluation in the emergency room if the symptoms worsen or if the condition fails to improve as anticipated.  Total time: 15 minutes. This includes time spent with the patient during the visit as well as time spent before and after the visit reviewing the chart, documenting the encounter, making phone calls, reviewing studies, etc.  Danna Hefty, [place], PGY2 10/10/2019 1:48 PM

## 2019-10-11 MED ORDER — ALBUTEROL SULFATE HFA 108 (90 BASE) MCG/ACT IN AERS: 2.0000 | INHALATION_SPRAY | Freq: Four times a day (QID) | RESPIRATORY_TRACT | 2 refills | Status: DC | PRN

## 2019-10-17 ENCOUNTER — Other Ambulatory Visit: Payer: Self-pay

## 2019-10-17 ENCOUNTER — Ambulatory Visit: Payer: Medicaid Other | Admitting: Family Medicine

## 2019-10-17 VITALS — BP 110/80 | HR 99 | Ht 64.0 in | Wt 303.2 lb

## 2019-10-17 DIAGNOSIS — Z30017 Encounter for initial prescription of implantable subdermal contraceptive: Secondary | ICD-10-CM | POA: Diagnosis not present

## 2019-10-17 DIAGNOSIS — Z3046 Encounter for surveillance of implantable subdermal contraceptive: Secondary | ICD-10-CM

## 2019-10-17 LAB — POCT URINE PREGNANCY: Preg Test, Ur: NEGATIVE

## 2019-10-17 MED ORDER — ETONOGESTREL 68 MG ~~LOC~~ IMPL
68.0000 mg | DRUG_IMPLANT | Freq: Once | SUBCUTANEOUS | Status: AC
  Administered 2019-10-17: 68 mg via SUBCUTANEOUS

## 2019-10-17 NOTE — Patient Instructions (Signed)

## 2019-10-17 NOTE — Progress Notes (Signed)
Subjective:   Patient ID: Donna [name]    DOB: 10-03-1995, 23 y.o. female   MRN: [id number]  Donna [name] is a 24 y.o. female  here for contraception management.  Contraception Management: Prior Contraception: [place] birth control pills She is interested in [place].  Urine pregnancy test today: negative Patient is right handed. Left arm used as it is nondominant.    Review of Systems:  Per HPI.   Objective:   BP 110/80   Pulse 99   Ht 5\' 4"  (1.626 m)   Wt (!) 303 lb 3.2 oz (137.5 kg)   SpO2 97%   BMI 52.04 kg/m  Vitals and nursing note reviewed.  General: Pleasant young female, sitting complaint exam chair, well nourished, well developed, in no acute distress with non-toxic appearance Resp: Breathing comfortably on room air, speaking full sentences Skin: warm, dry Extremities: warm and well perfused MSK:  gait normal Neuro: Alert and oriented, speech normal  Nexplanon Insertion: PRE-OP DIAGNOSIS: Patient desires long-term, reversible contraception. POST-OP DIAGNOSIS: Same PROCEDURE: Nexplanon placement Performing Physician: Dr. Mina Marble, DO PROCEDURE: -Written and verbal informed consent obtained, risks discussed included: bleeding, irregular menses, infection, pain/discomfort, cost for removal. -The appropriate timeout was taken and the patient's non-dominant hand was identified. -Patient was instructed to lie supine on the examination table with her non-dominant arm flexed at the elbow and externally rotated so that her hand was underneath her head (or as close as possible). -The insertion site was identified on the inner side of the non-dominant upper arm, 8 -10 cm from medial epicondyle of the humerus and 3-5 cm posterior to the sulcus (groove) between the biceps and triceps muscles. The insertion site was marked with a sterile marker. -A second spot (guiding mark) was made with the sterile marker 5 cm proximal (toward the shoulder) to the mark  of the insertion site to serve as a direction guide during the Nexplanon insertion. -The insertion site was confirmed as the correct location on the inner side of the arm. -The skin was cleaned from the insertion site to the guiding mark with an antiseptic solution (Betadine / Chloraprep) and draped in a sterile fashion. -Anesthesia was achieved by injecting 2 mL of 1% lidocaine (without epinephrine) just under the skin along the planned insertion tunnel. Anesthesia confirmed. -The skin was punctured with the tip of the Nexplanon trocar needle slightly angled less than 30. The needle was inserted until the bevel (slanted opening of the tip) was just under the skin (and no further). -The applicator was then lowered to a horizontal position. While lifting the skin with the tip of the needle, the needle was inserted to its full length. Mild resistance was felt but no excessive force was used. The needle slid in easily. -The applicator was kept in the same position with the needle inserted to its full length. The free hand was used to keep the applicator in the same position. Then the purple slider was unlocked by pushing it slightly down. The purple slider was then moved fully back until it stopped, delivering the Nexplanon capsule subcutaneously. The trocar was removed from the insertion site. -Nexplanon capsule was palpated by provider and patient to assure satisfactory placement. -A Bandage and a pressure pressing were applied to the area. Patient to keep the pressure dressing for 24 hrs and the bandage for 3-5 days. -Estimated blood loss was less than 0.5 mL  Assessment & Plan:   Encounter for initial prescription of implantable subdermal contraceptive Nexplanon  Insertion today into left arm. The patient tolerated the procedure well without complications. - urine pregnancy test negative today - Anticipatory guidance, as well as standard post-procedure care, was explained. - Return precautions are  given - patient counseled to continue current birth control for next 7 days for pregnancy protection - Card with exchange date provided and explained  Orders Placed This Encounter  Procedures  . POCT urine pregnancy   Meds ordered this encounter  Medications  . etonogestrel (NEXPLANON) implant 68 mg    Mina Marble, DO PGY-3, [facility] 10/17/2019 8:24 PM

## 2019-10-17 NOTE — Assessment & Plan Note (Signed)
Nexplanon Insertion today into left arm. The patient tolerated the procedure well without complications. - urine pregnancy test negative today - Anticipatory guidance, as well as standard post-procedure care, was explained. - Return precautions are given - patient counseled to continue current birth control for next 7 days for pregnancy protection - Card with exchange date provided and explained

## 2019-10-19 MED ORDER — ETONOGESTREL 68 MG ~~LOC~~ IMPL: 68.0000 mg | DRUG_IMPLANT | Freq: Once | SUBCUTANEOUS | Status: DC

## 2019-10-19 NOTE — Addendum Note (Signed)
Addended by: Talbot Grumbling on: 10/19/2019 04:05 PM   Modules accepted: Orders

## 2019-10-25 DIAGNOSIS — Z713 Dietary counseling and surveillance: Secondary | ICD-10-CM | POA: Diagnosis not present

## 2019-10-30 DIAGNOSIS — R7303 Prediabetes: Secondary | ICD-10-CM | POA: Diagnosis not present

## 2019-10-30 DIAGNOSIS — Z6841 Body Mass Index (BMI) 40.0 and over, adult: Secondary | ICD-10-CM | POA: Diagnosis not present

## 2019-11-29 DIAGNOSIS — Z713 Dietary counseling and surveillance: Secondary | ICD-10-CM | POA: Diagnosis not present

## 2019-11-30 DIAGNOSIS — R7303 Prediabetes: Secondary | ICD-10-CM | POA: Diagnosis not present

## 2019-12-05 ENCOUNTER — Other Ambulatory Visit: Payer: Self-pay

## 2019-12-05 DIAGNOSIS — Z20822 Contact with and (suspected) exposure to covid-19: Secondary | ICD-10-CM | POA: Diagnosis not present

## 2019-12-07 LAB — SARS-COV-2, NAA 2 DAY TAT

## 2019-12-07 LAB — NOVEL CORONAVIRUS, NAA: SARS-CoV-2, NAA: NOT DETECTED

## 2020-01-02 DIAGNOSIS — Z6841 Body Mass Index (BMI) 40.0 and over, adult: Secondary | ICD-10-CM | POA: Diagnosis not present

## 2020-01-02 DIAGNOSIS — R7303 Prediabetes: Secondary | ICD-10-CM | POA: Diagnosis not present

## 2020-02-06 DIAGNOSIS — R7303 Prediabetes: Secondary | ICD-10-CM | POA: Diagnosis not present

## 2020-02-06 DIAGNOSIS — Z6841 Body Mass Index (BMI) 40.0 and over, adult: Secondary | ICD-10-CM | POA: Diagnosis not present

## 2020-02-27 DIAGNOSIS — F4310 Post-traumatic stress disorder, unspecified: Secondary | ICD-10-CM | POA: Diagnosis not present

## 2020-02-27 DIAGNOSIS — F4320 Adjustment disorder, unspecified: Secondary | ICD-10-CM | POA: Diagnosis not present

## 2020-02-27 DIAGNOSIS — Z6841 Body Mass Index (BMI) 40.0 and over, adult: Secondary | ICD-10-CM | POA: Diagnosis not present

## 2020-02-28 DIAGNOSIS — F4320 Adjustment disorder, unspecified: Secondary | ICD-10-CM | POA: Diagnosis not present

## 2020-02-28 DIAGNOSIS — Z8659 Personal history of other mental and behavioral disorders: Secondary | ICD-10-CM | POA: Diagnosis not present

## 2020-02-28 DIAGNOSIS — Z6841 Body Mass Index (BMI) 40.0 and over, adult: Secondary | ICD-10-CM | POA: Diagnosis not present

## 2020-03-22 HISTORY — PX: HERNIA REPAIR: SHX51

## 2020-05-20 HISTORY — PX: GASTRIC BYPASS: SHX52

## 2020-06-09 DIAGNOSIS — Z01818 Encounter for other preprocedural examination: Secondary | ICD-10-CM | POA: Diagnosis not present

## 2020-06-13 DIAGNOSIS — Z6841 Body Mass Index (BMI) 40.0 and over, adult: Secondary | ICD-10-CM | POA: Diagnosis not present

## 2020-06-13 DIAGNOSIS — Z01812 Encounter for preprocedural laboratory examination: Secondary | ICD-10-CM | POA: Diagnosis not present

## 2020-06-13 DIAGNOSIS — Z20822 Contact with and (suspected) exposure to covid-19: Secondary | ICD-10-CM | POA: Diagnosis not present

## 2020-06-16 ENCOUNTER — Ambulatory Visit
Admission: RE | Admit: 2020-06-16 | Discharge: 2020-06-16 | Disposition: A | Discharge status: Home / Self Care | Payer: Medicaid Other | Source: Ambulatory Visit | Attending: Family Medicine | Admitting: Family Medicine

## 2020-06-16 ENCOUNTER — Ambulatory Visit (INDEPENDENT_AMBULATORY_CARE_PROVIDER_SITE_OTHER): Payer: Medicaid Other | Admitting: Family Medicine

## 2020-06-16 ENCOUNTER — Other Ambulatory Visit: Payer: Self-pay

## 2020-06-16 ENCOUNTER — Encounter: Payer: Self-pay | Admitting: Family Medicine

## 2020-06-16 VITALS — BP 130/80 | HR 77 | Wt 270.2 lb

## 2020-06-16 DIAGNOSIS — M25561 Pain in right knee: Secondary | ICD-10-CM | POA: Insufficient documentation

## 2020-06-16 MED ORDER — ACETAMINOPHEN 160 MG/5ML PO SOLN
500.0000 mg | Freq: Four times a day (QID) | ORAL | 0 refills | Status: DC | PRN
Start: 2020-06-16 — End: 2023-12-06
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
## 2020-06-19 DIAGNOSIS — K660 Peritoneal adhesions (postprocedural) (postinfection): Secondary | ICD-10-CM | POA: Diagnosis not present

## 2020-06-19 DIAGNOSIS — J45909 Unspecified asthma, uncomplicated: Secondary | ICD-10-CM | POA: Diagnosis not present

## 2020-06-19 DIAGNOSIS — J45990 Exercise induced bronchospasm: Secondary | ICD-10-CM | POA: Diagnosis not present

## 2020-06-19 DIAGNOSIS — D509 Iron deficiency anemia, unspecified: Secondary | ICD-10-CM | POA: Diagnosis not present

## 2020-06-19 DIAGNOSIS — R7303 Prediabetes: Secondary | ICD-10-CM | POA: Diagnosis not present

## 2020-06-19 DIAGNOSIS — Z6841 Body Mass Index (BMI) 40.0 and over, adult: Secondary | ICD-10-CM | POA: Diagnosis not present

## 2020-06-19 DIAGNOSIS — J302 Other seasonal allergic rhinitis: Secondary | ICD-10-CM | POA: Diagnosis not present

## 2020-06-23 ENCOUNTER — Telehealth: Payer: Self-pay

## 2020-06-23 NOTE — Telephone Encounter (Signed)
Transition Care Management Follow-up Telephone Call  Date of discharge and from where: 06/20/2020 from [facility]   How have you been since you were released from the hospital? Pt stated that she if feeling okay. She had elective bariatric bypass.   Any questions or concerns? No  Items Reviewed:  Did the pt receive and understand the discharge instructions provided? Yes   Medications obtained and verified? Yes   Other? No   Any new allergies since your discharge? Yes   Dietary orders reviewed? Liquid protein only.   Do you have support at home? Yes   Functional Questionnaire: (I = Independent and D = Dependent) ADLs: I  Bathing/Dressing- I  Meal Prep- I  Eating- I  Maintaining continence- I  Transferring/Ambulation- I  Managing Meds- I   Follow up appointments reviewed:   PCP Hospital f/u appt confirmed? No    Specialist Hospital f/u appt confirmed? Yes  Scheduled to see [facility] Surgeon on May 1st.  Are transportation arrangements needed? No   If their condition worsens, is the pt aware to call PCP or go to the Emergency Dept.? Yes  Was the patient provided with contact information for the PCP's office or ED? Yes  Was to pt encouraged to call back with questions or concerns? Yes .

## 2020-07-05 ENCOUNTER — Encounter: Payer: Self-pay | Admitting: Family Medicine

## 2020-07-05 DIAGNOSIS — R1013 Epigastric pain: Secondary | ICD-10-CM | POA: Diagnosis not present

## 2020-07-05 DIAGNOSIS — R109 Unspecified abdominal pain: Secondary | ICD-10-CM | POA: Diagnosis not present

## 2020-07-05 DIAGNOSIS — R000 Tachycardia, unspecified: Secondary | ICD-10-CM | POA: Diagnosis not present

## 2020-07-05 DIAGNOSIS — N3000 Acute cystitis without hematuria: Secondary | ICD-10-CM | POA: Diagnosis not present

## 2020-07-05 DIAGNOSIS — M549 Dorsalgia, unspecified: Secondary | ICD-10-CM | POA: Diagnosis not present

## 2020-07-05 DIAGNOSIS — R21 Rash and other nonspecific skin eruption: Secondary | ICD-10-CM | POA: Diagnosis not present

## 2020-07-05 DIAGNOSIS — Z9884 Bariatric surgery status: Secondary | ICD-10-CM | POA: Diagnosis not present

## 2020-07-05 DIAGNOSIS — R079 Chest pain, unspecified: Secondary | ICD-10-CM | POA: Diagnosis not present

## 2020-07-05 DIAGNOSIS — Z9049 Acquired absence of other specified parts of digestive tract: Secondary | ICD-10-CM | POA: Diagnosis not present

## 2020-07-05 DIAGNOSIS — N3289 Other specified disorders of bladder: Secondary | ICD-10-CM | POA: Diagnosis not present

## 2020-07-05 DIAGNOSIS — Z3202 Encounter for pregnancy test, result negative: Secondary | ICD-10-CM | POA: Diagnosis not present

## 2020-07-05 DIAGNOSIS — R0789 Other chest pain: Secondary | ICD-10-CM | POA: Diagnosis not present

## 2020-07-05 DIAGNOSIS — Z9889 Other specified postprocedural states: Secondary | ICD-10-CM | POA: Diagnosis not present

## 2020-07-06 DIAGNOSIS — R079 Chest pain, unspecified: Secondary | ICD-10-CM | POA: Diagnosis not present

## 2020-07-06 DIAGNOSIS — Z9049 Acquired absence of other specified parts of digestive tract: Secondary | ICD-10-CM | POA: Diagnosis not present

## 2020-07-06 DIAGNOSIS — N3289 Other specified disorders of bladder: Secondary | ICD-10-CM | POA: Diagnosis not present

## 2020-07-06 DIAGNOSIS — Z9889 Other specified postprocedural states: Secondary | ICD-10-CM | POA: Diagnosis not present

## 2020-07-08 ENCOUNTER — Other Ambulatory Visit: Payer: Self-pay | Admitting: Family Medicine

## 2020-07-08 DIAGNOSIS — J302 Other seasonal allergic rhinitis: Secondary | ICD-10-CM

## 2020-07-08 MED ORDER — CETIRIZINE HCL 10 MG PO TABS: 10.0000 mg | ORAL_TABLET | Freq: Every day | ORAL | 3 refills | Status: DC

## 2020-07-08 MED ORDER — KETOTIFEN FUMARATE 0.025 % OP SOLN
1.0000 [drp] | Freq: Every day | OPHTHALMIC | 2 refills | Status: DC
Start: 2020-07-08 — End: 2023-12-06

## 2020-07-08 MED ORDER — FLUTICASONE PROPIONATE 50 MCG/ACT NA SUSP: 2.0000 | Freq: Every day | NASAL | 3 refills | Status: DC

## 2020-07-17 DIAGNOSIS — Z713 Dietary counseling and surveillance: Secondary | ICD-10-CM | POA: Diagnosis not present

## 2020-07-17 DIAGNOSIS — Z9884 Bariatric surgery status: Secondary | ICD-10-CM | POA: Diagnosis not present

## 2020-09-25 DIAGNOSIS — Z9884 Bariatric surgery status: Secondary | ICD-10-CM | POA: Diagnosis not present

## 2020-09-25 DIAGNOSIS — Z713 Dietary counseling and surveillance: Secondary | ICD-10-CM | POA: Diagnosis not present

## 2021-02-23 ENCOUNTER — Other Ambulatory Visit: Payer: Self-pay

## 2021-02-23 ENCOUNTER — Encounter: Payer: Self-pay | Admitting: Family Medicine

## 2021-02-23 ENCOUNTER — Ambulatory Visit: Payer: Medicaid Other | Admitting: Family Medicine

## 2021-02-23 VITALS — BP 101/72 | HR 66 | Ht 64.0 in | Wt 209.4 lb

## 2021-02-23 DIAGNOSIS — Z3200 Encounter for pregnancy test, result unknown: Secondary | ICD-10-CM

## 2021-02-23 LAB — POCT URINE PREGNANCY: Preg Test, Ur: NEGATIVE

## 2021-02-23 NOTE — Progress Notes (Signed)
    SUBJECTIVE:   CHIEF COMPLAINT / HPI:   Patient presents today removing nexplanon, had it inserted about a year ago. She is accompanied by her husband this morning, they are wanting to conceive and thus the reason for nexplanon removal. Originally needed to be on contraception such as nexplanon since she had gastric bypass surgery and was on oral contraceptive pills before but they told her this was not enough. Gastric bypass performed March 2022. LMP 11/15, have been irregular cycles since. Denies vaginal discharge or other GU complains.   OBJECTIVE:   BP 101/72   Pulse 66   Ht 5\' 4"  (1.626 m)   Wt 209 lb 6.4 oz (95 kg)   LMP 02/06/2021   SpO2 100%   BMI 35.94 kg/m   General: Patient well-appearing, in no acute distress. CV: RRR, no murmurs or gallops auscultated Resp: CTAB Abdomen: soft, nontender, presence of bowel sounds Psych: mood appropriate   ASSESSMENT/PLAN:    -Upreg negative -PHQ-9 score of 0 reviewed -Scheduled nexplanon removal 12/15 -Instructed patient to take prenatal vitamin daily    Darling Cieslewicz Larae Grooms, [place]

## 2021-02-23 NOTE — Patient Instructions (Signed)
It was great seeing you today!  Today we discussed nexplanon removal. I have scheduled you for an appointment at our clinic on 03/05/2021 at 10:20 am to remove your nexplanon.   Please make sure to take a prenatal vitamin daily.   Please follow up at your next scheduled appointment on 12/15, if anything arises between now and then, please don't hesitate to contact our office.   Thank you for allowing [place] to be a part of your medical care!  Thank you, Dr. Larae Grooms

## 2021-03-05 ENCOUNTER — Ambulatory Visit: Payer: Medicaid Other

## 2021-03-12 ENCOUNTER — Ambulatory Visit (INDEPENDENT_AMBULATORY_CARE_PROVIDER_SITE_OTHER): Payer: Medicaid Other | Admitting: Family Medicine

## 2021-03-12 ENCOUNTER — Other Ambulatory Visit: Payer: Self-pay

## 2021-03-12 DIAGNOSIS — Z30017 Encounter for initial prescription of implantable subdermal contraceptive: Secondary | ICD-10-CM | POA: Diagnosis not present

## 2021-03-12 DIAGNOSIS — Z3046 Encounter for surveillance of implantable subdermal contraceptive: Secondary | ICD-10-CM | POA: Diagnosis not present

## 2021-03-12 MED ORDER — PRENATAL VITAMIN 27-0.8 MG PO TABS: 1.0000 | ORAL_TABLET | Freq: Every day | ORAL | 3 refills | Status: DC

## 2021-03-12 NOTE — Patient Instructions (Signed)
We removed your nexplanon today so you are able to conceive.  I have sent in some prenatal vitamins for you. Have a Happy Holiday!

## 2021-03-12 NOTE — Progress Notes (Signed)
PROCEDURE NOTE: Hope Patient given informed consent and signed copy in the chart. LEFT arm area prepped and draped in the usual sterile fashion. Three cc of lidocaine without epinephrine 1% used for local anesthesia. A small stab incision was made close to the nexplanon with scalpel. Hemostats were used to withdraw the nexplanon. A small bandage was applied over a steri strip  No complications.Patient given follow up instructions should she experience redness, swelling at sight or fever in the next 24 hours. Patient was reminded this totally removes her nexplanon contraceptive devise. (she can now potentially conceive)

## 2021-05-04 DIAGNOSIS — Z9884 Bariatric surgery status: Secondary | ICD-10-CM | POA: Diagnosis not present

## 2021-08-25 ENCOUNTER — Encounter: Payer: Self-pay | Admitting: *Deleted

## 2021-09-04 DIAGNOSIS — Z3201 Encounter for pregnancy test, result positive: Secondary | ICD-10-CM | POA: Diagnosis not present

## 2021-10-01 DIAGNOSIS — Z369 Encounter for antenatal screening, unspecified: Secondary | ICD-10-CM | POA: Diagnosis not present

## 2021-10-01 DIAGNOSIS — O3680X0 Pregnancy with inconclusive fetal viability, not applicable or unspecified: Secondary | ICD-10-CM | POA: Diagnosis not present

## 2021-10-01 DIAGNOSIS — Z3689 Encounter for other specified antenatal screening: Secondary | ICD-10-CM | POA: Diagnosis not present

## 2021-10-01 DIAGNOSIS — R87610 Atypical squamous cells of undetermined significance on cytologic smear of cervix (ASC-US): Secondary | ICD-10-CM | POA: Diagnosis not present

## 2021-10-01 DIAGNOSIS — O99211 Obesity complicating pregnancy, first trimester: Secondary | ICD-10-CM | POA: Diagnosis not present

## 2021-10-01 DIAGNOSIS — Z3A08 8 weeks gestation of pregnancy: Secondary | ICD-10-CM | POA: Diagnosis not present

## 2021-10-01 DIAGNOSIS — N72 Inflammatory disease of cervix uteri: Secondary | ICD-10-CM | POA: Diagnosis not present

## 2021-10-01 DIAGNOSIS — O210 Mild hyperemesis gravidarum: Secondary | ICD-10-CM | POA: Diagnosis not present

## 2021-10-01 DIAGNOSIS — O219 Vomiting of pregnancy, unspecified: Secondary | ICD-10-CM | POA: Diagnosis not present

## 2021-10-30 DIAGNOSIS — Z3481 Encounter for supervision of other normal pregnancy, first trimester: Secondary | ICD-10-CM | POA: Diagnosis not present

## 2021-12-14 DIAGNOSIS — Z362 Encounter for other antenatal screening follow-up: Secondary | ICD-10-CM | POA: Diagnosis not present

## 2021-12-14 DIAGNOSIS — Z3A19 19 weeks gestation of pregnancy: Secondary | ICD-10-CM | POA: Diagnosis not present

## 2021-12-14 DIAGNOSIS — O99212 Obesity complicating pregnancy, second trimester: Secondary | ICD-10-CM | POA: Diagnosis not present

## 2021-12-14 DIAGNOSIS — O321XX0 Maternal care for breech presentation, not applicable or unspecified: Secondary | ICD-10-CM | POA: Diagnosis not present

## 2022-01-11 DIAGNOSIS — B9689 Other specified bacterial agents as the cause of diseases classified elsewhere: Secondary | ICD-10-CM | POA: Diagnosis not present

## 2022-01-11 DIAGNOSIS — O26892 Other specified pregnancy related conditions, second trimester: Secondary | ICD-10-CM | POA: Diagnosis not present

## 2022-01-11 DIAGNOSIS — Z3A23 23 weeks gestation of pregnancy: Secondary | ICD-10-CM | POA: Diagnosis not present

## 2022-01-11 DIAGNOSIS — N898 Other specified noninflammatory disorders of vagina: Secondary | ICD-10-CM | POA: Diagnosis not present

## 2022-01-11 DIAGNOSIS — O23592 Infection of other part of genital tract in pregnancy, second trimester: Secondary | ICD-10-CM | POA: Diagnosis not present

## 2022-02-08 DIAGNOSIS — Z3689 Encounter for other specified antenatal screening: Secondary | ICD-10-CM | POA: Diagnosis not present

## 2022-02-08 DIAGNOSIS — Z3A27 27 weeks gestation of pregnancy: Secondary | ICD-10-CM | POA: Diagnosis not present

## 2022-02-08 DIAGNOSIS — O99212 Obesity complicating pregnancy, second trimester: Secondary | ICD-10-CM | POA: Diagnosis not present

## 2022-02-08 DIAGNOSIS — Z3A26 26 weeks gestation of pregnancy: Secondary | ICD-10-CM | POA: Diagnosis not present

## 2022-03-09 DIAGNOSIS — O99013 Anemia complicating pregnancy, third trimester: Secondary | ICD-10-CM | POA: Diagnosis not present

## 2022-03-09 DIAGNOSIS — O99213 Obesity complicating pregnancy, third trimester: Secondary | ICD-10-CM | POA: Diagnosis not present

## 2022-03-09 DIAGNOSIS — Z3A31 31 weeks gestation of pregnancy: Secondary | ICD-10-CM | POA: Diagnosis not present

## 2022-03-09 DIAGNOSIS — D509 Iron deficiency anemia, unspecified: Secondary | ICD-10-CM | POA: Diagnosis not present

## 2022-03-09 DIAGNOSIS — Z3689 Encounter for other specified antenatal screening: Secondary | ICD-10-CM | POA: Diagnosis not present

## 2022-04-06 DIAGNOSIS — Z3689 Encounter for other specified antenatal screening: Secondary | ICD-10-CM | POA: Diagnosis not present

## 2022-04-06 DIAGNOSIS — O99213 Obesity complicating pregnancy, third trimester: Secondary | ICD-10-CM | POA: Diagnosis not present

## 2022-04-06 DIAGNOSIS — O365931 Maternal care for other known or suspected poor fetal growth, third trimester, fetus 1: Secondary | ICD-10-CM | POA: Diagnosis not present

## 2022-04-06 DIAGNOSIS — Z3A35 35 weeks gestation of pregnancy: Secondary | ICD-10-CM | POA: Diagnosis not present

## 2022-04-09 DIAGNOSIS — Z9884 Bariatric surgery status: Secondary | ICD-10-CM | POA: Diagnosis not present

## 2022-04-09 DIAGNOSIS — O99013 Anemia complicating pregnancy, third trimester: Secondary | ICD-10-CM | POA: Diagnosis not present

## 2022-04-09 DIAGNOSIS — D509 Iron deficiency anemia, unspecified: Secondary | ICD-10-CM | POA: Diagnosis not present

## 2022-04-09 DIAGNOSIS — Z3A35 35 weeks gestation of pregnancy: Secondary | ICD-10-CM | POA: Diagnosis not present

## 2022-04-12 DIAGNOSIS — Z3A36 36 weeks gestation of pregnancy: Secondary | ICD-10-CM | POA: Diagnosis not present

## 2022-04-12 DIAGNOSIS — O99820 Streptococcus B carrier state complicating pregnancy: Secondary | ICD-10-CM | POA: Diagnosis not present

## 2022-04-12 DIAGNOSIS — O3663X0 Maternal care for excessive fetal growth, third trimester, not applicable or unspecified: Secondary | ICD-10-CM | POA: Diagnosis not present

## 2022-04-12 DIAGNOSIS — O365930 Maternal care for other known or suspected poor fetal growth, third trimester, not applicable or unspecified: Secondary | ICD-10-CM | POA: Diagnosis not present

## 2022-04-19 DIAGNOSIS — O365930 Maternal care for other known or suspected poor fetal growth, third trimester, not applicable or unspecified: Secondary | ICD-10-CM | POA: Diagnosis not present

## 2022-04-19 DIAGNOSIS — Z3A37 37 weeks gestation of pregnancy: Secondary | ICD-10-CM | POA: Diagnosis not present

## 2022-04-25 DIAGNOSIS — O9902 Anemia complicating childbirth: Secondary | ICD-10-CM | POA: Diagnosis not present

## 2022-04-25 DIAGNOSIS — O99844 Bariatric surgery status complicating childbirth: Secondary | ICD-10-CM | POA: Diagnosis not present

## 2022-04-25 DIAGNOSIS — D509 Iron deficiency anemia, unspecified: Secondary | ICD-10-CM | POA: Diagnosis not present

## 2022-04-25 DIAGNOSIS — Z3A38 38 weeks gestation of pregnancy: Secondary | ICD-10-CM | POA: Diagnosis not present

## 2022-04-25 DIAGNOSIS — O365930 Maternal care for other known or suspected poor fetal growth, third trimester, not applicable or unspecified: Secondary | ICD-10-CM | POA: Diagnosis not present

## 2022-04-26 DIAGNOSIS — Z3A38 38 weeks gestation of pregnancy: Secondary | ICD-10-CM | POA: Diagnosis not present

## 2022-04-26 DIAGNOSIS — O365930 Maternal care for other known or suspected poor fetal growth, third trimester, not applicable or unspecified: Secondary | ICD-10-CM | POA: Diagnosis not present

## 2022-05-26 DIAGNOSIS — Z302 Encounter for sterilization: Secondary | ICD-10-CM | POA: Diagnosis not present

## 2022-07-22 DIAGNOSIS — Z30017 Encounter for initial prescription of implantable subdermal contraceptive: Secondary | ICD-10-CM | POA: Diagnosis not present

## 2022-12-12 DIAGNOSIS — M7989 Other specified soft tissue disorders: Secondary | ICD-10-CM | POA: Diagnosis not present

## 2022-12-12 DIAGNOSIS — M79603 Pain in arm, unspecified: Secondary | ICD-10-CM | POA: Diagnosis not present

## 2022-12-12 DIAGNOSIS — I10 Essential (primary) hypertension: Secondary | ICD-10-CM | POA: Diagnosis not present

## 2022-12-12 DIAGNOSIS — R000 Tachycardia, unspecified: Secondary | ICD-10-CM | POA: Diagnosis not present

## 2022-12-12 DIAGNOSIS — S51011D Laceration without foreign body of right elbow, subsequent encounter: Secondary | ICD-10-CM | POA: Diagnosis not present

## 2022-12-12 DIAGNOSIS — S41111A Laceration without foreign body of right upper arm, initial encounter: Secondary | ICD-10-CM | POA: Diagnosis not present

## 2023-08-30 ENCOUNTER — Encounter: Payer: Self-pay | Admitting: *Deleted

## 2023-12-05 NOTE — Progress Notes (Unsigned)
    SUBJECTIVE:   Chief compliant/HPI: annual examination  Donna [name] is a 28 y.o. who presents today for an annual exam.   Would like iron levels checked Needed 4 iron infusions during last pregnancy 14mo ago, hx gastric bypass (3 years ago in [place] - March 2022) Still taking recommended vitamins after bypass surgery Taking - Calcium citrate 500mg , Fortify vitamin daily, OTC iron for last 3 weeks   Notes mild cramping during her last 2 menstrual cycles, usually does not have menstrual cramps.  She had a Nexplanon  placed 1 year ago.  Cycles usually last 3 to 4 days.  Review of systems form notable for none.   Updated history tabs and problem list.   OBJECTIVE:   BP 107/71   Pulse 71   Ht 5' 4 (1.626 m)   Wt 204 lb 3.2 oz (92.6 kg)   LMP 11/25/2023   SpO2 97%   BMI 35.05 kg/m   General: well appearing, NAD Cardiovascular: RRR, no m/r/g Respiratory: normal work of breathing on RA, CTAB Abdomen: soft, non-distended Pelvic - Thersia Meissner, CMA present. VULVA: normal appearing vulva with no masses, tenderness or lesions  VAGINA: normal appearing vagina with normal color and discharge, no lesions  CERVIX: normal appearing cervix without discharge or lesions, no CMT  Thin prep pap is done with HR HPV cotesting if ASCUS  UTERUS: uterus is felt to be normal size, shape, consistency and nontender   ADNEXA: No adnexal masses or tenderness noted.  ASSESSMENT/PLAN:   Assessment & Plan Iron deficiency anemia, unspecified iron deficiency anemia type History of iron deficiency anemia requiring 2 iron transfusions during most recent pregnancy, also history of gastric bypass. Takes over-the-counter iron supplement Will check iron levels today Cervical cancer screening Pap completed today, prior in 2018 was normal Screening examination for STD (sexually transmitted disease) Gonorrhea, chlamydia, HIV, RPR completed patient request Encounter for immunization Flu shot  given Annual Examination  See AVS for age appropriate recommendations.   PHQ not completed by pt Blood pressure reviewed and at goal.  Asked about intimate partner violence and patient reports no.  The patient currently uses nexplanon  for contraception.  Advanced directives not discussed   Considered the following items based upon USPSTF recommendations: HIV testing: ordered Hepatitis C: ordered Hepatitis B: not ordered Syphilis if at high risk: ordered as part of STD screen GC/CT ordered at pt request Lipid panel (nonfasting or fasting) discussed based upon AHA recommendations and not ordered.  Consider repeat every 4-6 years.   Discussed family history, BRCA testing not indicated.  Cervical cancer screening: due for Pap today, cytology + HPV ordered Immunizations flu shot given  MyChart Activation:Already signed up   Follow up in 1  year or sooner if indicated.    Elyce Prescott, DO [facility]

## 2023-12-06 ENCOUNTER — Ambulatory Visit (INDEPENDENT_AMBULATORY_CARE_PROVIDER_SITE_OTHER): Admitting: Family Medicine

## 2023-12-06 ENCOUNTER — Encounter: Payer: Self-pay | Admitting: Family Medicine

## 2023-12-06 ENCOUNTER — Other Ambulatory Visit (HOSPITAL_COMMUNITY)
Admission: RE | Admit: 2023-12-06 | Discharge: 2023-12-06 | Disposition: A | Discharge status: Home / Self Care | Source: Ambulatory Visit | Attending: Family Medicine | Admitting: Family Medicine

## 2023-12-06 VITALS — BP 107/71 | HR 71 | Ht 64.0 in | Wt 204.2 lb

## 2023-12-06 DIAGNOSIS — Z23 Encounter for immunization: Secondary | ICD-10-CM | POA: Diagnosis not present

## 2023-12-06 DIAGNOSIS — D509 Iron deficiency anemia, unspecified: Secondary | ICD-10-CM | POA: Diagnosis not present

## 2023-12-06 DIAGNOSIS — Z124 Encounter for screening for malignant neoplasm of cervix: Secondary | ICD-10-CM

## 2023-12-06 DIAGNOSIS — Z113 Encounter for screening for infections with a predominantly sexual mode of transmission: Secondary | ICD-10-CM | POA: Diagnosis not present

## 2023-12-06 NOTE — Patient Instructions (Signed)
 Good to see you today - Thank you for coming in  Things we discussed today: I will let you know if your Pap or STD testing is abnormal, along with your iron levels  Come back to see me in 1 year!

## 2023-12-06 NOTE — Assessment & Plan Note (Signed)
 History of iron deficiency anemia requiring 2 iron transfusions during most recent pregnancy, also history of gastric bypass. Takes over-the-counter iron supplement Will check iron levels today

## 2023-12-07 ENCOUNTER — Telehealth (HOSPITAL_COMMUNITY): Payer: Self-pay | Admitting: Family Medicine

## 2023-12-07 ENCOUNTER — Ambulatory Visit: Payer: Self-pay | Admitting: Family Medicine

## 2023-12-07 ENCOUNTER — Telehealth: Payer: Self-pay | Admitting: Pharmacy Technician

## 2023-12-07 ENCOUNTER — Other Ambulatory Visit: Payer: Self-pay | Admitting: Family Medicine

## 2023-12-07 DIAGNOSIS — D509 Iron deficiency anemia, unspecified: Secondary | ICD-10-CM

## 2023-12-07 LAB — IRON,TIBC AND FERRITIN PANEL
Ferritin: 8 ng/mL — ABNORMAL LOW (ref 15–150)
Iron Saturation: 9 % — CL (ref 15–55)
Iron: 32 ug/dL (ref 27–159)
Total Iron Binding Capacity: 363 ug/dL (ref 250–450)
UIBC: 331 ug/dL (ref 131–425)

## 2023-12-07 LAB — HIV ANTIBODY (ROUTINE TESTING W REFLEX): HIV Screen 4th Generation wRfx: NONREACTIVE

## 2023-12-07 LAB — HEPATITIS C ANTIBODY: Hep C Virus Ab: NONREACTIVE

## 2023-12-07 LAB — RPR: RPR Ser Ql: NONREACTIVE

## 2023-12-07 NOTE — Progress Notes (Signed)
 Iron low, hx gastric bypass, indicated for iron infusions. Will plan for 2 infusions and recheck labs a month later. Referral placed.

## 2023-12-07 NOTE — Telephone Encounter (Signed)
 Patient referred to infusion pharmacy team for ambulatory infusion of IV iron.  Insurance - [place] Medicaid Prepaid Plan Site of care - Site of care: [facility] Dx code - D50.9 IV Iron Therapy - Feraheme 510 mg IV x 2  Infusion appointments - Scheduling team will schedule patient as soon as possible.    Siria Calandro D. Mizraim Harmening, PharmD

## 2023-12-07 NOTE — Telephone Encounter (Signed)
 Auth Submission: NO AUTH NEEDED Site of care: Site of care: [facility] Payer: [employer] med[employer] Medication & CPT/J Code(s) submitted: Feraheme (ferumoxytol) [id number] Diagnosis Code: d50.9 Route of submission (phone, fax, portal):  Phone # Fax # Auth type: Buy/Bill PB Units/visits requested: x2 doses Reference number:  Approval from: 12/07/23 to 03/21/24

## 2023-12-09 LAB — CYTOLOGY - PAP
Chlamydia: NEGATIVE
Comment: NEGATIVE
Comment: NEGATIVE
Comment: NORMAL
Diagnosis: NEGATIVE
Diagnosis: REACTIVE
Neisseria Gonorrhea: NEGATIVE
Trichomonas: NEGATIVE

## 2023-12-19 ENCOUNTER — Ambulatory Visit (INDEPENDENT_AMBULATORY_CARE_PROVIDER_SITE_OTHER)

## 2023-12-19 VITALS — BP 112/73 | HR 67 | Temp 97.7°F | Resp 16 | Ht 64.0 in | Wt 205.8 lb

## 2023-12-19 DIAGNOSIS — D509 Iron deficiency anemia, unspecified: Secondary | ICD-10-CM | POA: Diagnosis not present

## 2023-12-19 MED ORDER — SODIUM CHLORIDE 0.9 % IV SOLN
510.0000 mg | Freq: Once | INTRAVENOUS | Status: AC
  Administered 2023-12-19: 510 mg via INTRAVENOUS
  Filled 2023-12-19: qty 17

## 2023-12-19 NOTE — Patient Instructions (Signed)

## 2023-12-19 NOTE — Progress Notes (Signed)
 Diagnosis: Iron Deficiency Anemia  Provider:  Praveen Mannam MD  Procedure: IV Infusion  IV Type: Peripheral, IV Location: L Forearm  Feraheme (Ferumoxytol), Dose: 510 mg  Infusion Start Time: 1038  Infusion Stop Time: 1056  Post Infusion IV Care: Observation period completed and Peripheral IV Discontinued  Discharge: Condition: Good, Destination: Home . AVS Declined  Performed by:  Sheba Whaling, RN

## 2023-12-26 ENCOUNTER — Ambulatory Visit (INDEPENDENT_AMBULATORY_CARE_PROVIDER_SITE_OTHER): Admitting: *Deleted

## 2023-12-26 VITALS — BP 116/69 | HR 93 | Temp 98.0°F | Resp 16 | Ht 64.0 in | Wt 204.6 lb

## 2023-12-26 DIAGNOSIS — D509 Iron deficiency anemia, unspecified: Secondary | ICD-10-CM | POA: Diagnosis not present

## 2023-12-26 MED ORDER — SODIUM CHLORIDE 0.9 % IV SOLN
510.0000 mg | Freq: Once | INTRAVENOUS | Status: AC
  Administered 2023-12-26: 510 mg via INTRAVENOUS
  Filled 2023-12-26: qty 17

## 2023-12-26 NOTE — Progress Notes (Signed)
 Diagnosis: Iron Deficiency Anemia  Provider:  Mannam, Praveen MD  Procedure: IV Infusion  IV Type: Peripheral, IV Location: L Antecubital  Feraheme (Ferumoxytol), Dose: 510 mg  Infusion Start Time: 1015  Infusion Stop Time: 1033  Post Infusion IV Care: Patient declined observation  Discharge: Condition: Good, Destination: Home . AVS Declined  Performed by:  Mathew Therisa NOVAK, RN

## 2024-03-05 NOTE — Addendum Note (Signed)
 Addended by: DAYNE SHERRY RAMAN on: 03/05/2024 09:11 AM   Modules accepted: Orders
